# Patient Record
Sex: Female | Born: 1985 | Race: White | Hispanic: No | Marital: Single | Smoking: Never smoker
Health system: Southern US, Community
[De-identification: ages and names within clinical notes are randomized; demographics above are authoritative.]

## PROBLEM LIST (undated history)

## (undated) DIAGNOSIS — N946 Dysmenorrhea, unspecified: Secondary | ICD-10-CM

## (undated) DIAGNOSIS — E282 Polycystic ovarian syndrome: Secondary | ICD-10-CM

## (undated) DIAGNOSIS — K829 Disease of gallbladder, unspecified: Secondary | ICD-10-CM

## (undated) HISTORY — DX: Polycystic ovarian syndrome: E28.2

## (undated) HISTORY — PX: TRIGGER FINGER RELEASE: SHX641

---

## 2008-09-12 ENCOUNTER — Emergency Department (HOSPITAL_COMMUNITY): Admission: EM | Admit: 2008-09-12 | Discharge: 2008-09-12 | Payer: Self-pay | Admitting: Emergency Medicine

## 2012-01-01 ENCOUNTER — Emergency Department (HOSPITAL_COMMUNITY)
Admission: EM | Admit: 2012-01-01 | Discharge: 2012-01-01 | Disposition: A | Discharge status: Home / Self Care | Payer: BC Managed Care – PPO | Attending: Emergency Medicine | Admitting: Emergency Medicine

## 2012-01-01 ENCOUNTER — Emergency Department (HOSPITAL_COMMUNITY): Payer: BC Managed Care – PPO

## 2012-01-01 ENCOUNTER — Encounter (HOSPITAL_COMMUNITY): Payer: Self-pay | Admitting: Emergency Medicine

## 2012-01-01 DIAGNOSIS — K8050 Calculus of bile duct without cholangitis or cholecystitis without obstruction: Secondary | ICD-10-CM

## 2012-01-01 DIAGNOSIS — R0602 Shortness of breath: Secondary | ICD-10-CM | POA: Insufficient documentation

## 2012-01-01 DIAGNOSIS — Z79899 Other long term (current) drug therapy: Secondary | ICD-10-CM | POA: Insufficient documentation

## 2012-01-01 DIAGNOSIS — R1011 Right upper quadrant pain: Secondary | ICD-10-CM | POA: Insufficient documentation

## 2012-01-01 DIAGNOSIS — K8020 Calculus of gallbladder without cholecystitis without obstruction: Secondary | ICD-10-CM | POA: Insufficient documentation

## 2012-01-01 DIAGNOSIS — R079 Chest pain, unspecified: Secondary | ICD-10-CM | POA: Insufficient documentation

## 2012-01-01 DIAGNOSIS — N946 Dysmenorrhea, unspecified: Secondary | ICD-10-CM | POA: Insufficient documentation

## 2012-01-01 HISTORY — DX: Dysmenorrhea, unspecified: N94.6

## 2012-01-01 LAB — COMPREHENSIVE METABOLIC PANEL
Alkaline Phosphatase: 89 U/L (ref 39–117)
BUN: 8 mg/dL (ref 6–23)
CO2: 29 mEq/L (ref 19–32)
Chloride: 103 mEq/L (ref 96–112)
GFR calc Af Amer: >90 mL/min (ref >90)
Glucose, Bld: 100 mg/dL — ABNORMAL HIGH (ref 70–99)
Potassium: 3.6 mEq/L (ref 3.5–5.1)
Total Bilirubin: 0.8 mg/dL (ref 0.3–1.2)

## 2012-01-01 LAB — CBC WITH DIFFERENTIAL/PLATELET
Hemoglobin: 15.1 g/dL — ABNORMAL HIGH (ref 12.0–15.0)
Lymphs Abs: 2.6 10*3/uL (ref 0.7–4.0)
MCH: 30 pg (ref 26.0–34.0)
Monocytes Relative: 5 % (ref 3–12)
Neutro Abs: 7.8 10*3/uL — ABNORMAL HIGH (ref 1.7–7.7)
Neutrophils Relative %: 70 % (ref 43–77)
RBC: 5.03 MIL/uL (ref 3.87–5.11)

## 2012-01-01 LAB — D-DIMER, QUANTITATIVE: D-Dimer, Quant: 0.55 ug/mL-FEU — ABNORMAL HIGH (ref 0.00–0.48)

## 2012-01-01 MED ORDER — PANTOPRAZOLE SODIUM 40 MG IV SOLR
40.0000 mg | Freq: Once | INTRAVENOUS | Status: AC
  Administered 2012-01-01: 40 mg via INTRAVENOUS
  Filled 2012-01-01: qty 40

## 2012-01-01 MED ORDER — SODIUM CHLORIDE 0.9 % IV SOLN
Freq: Once | INTRAVENOUS | Status: AC
  Administered 2012-01-01: 04:00:00 via INTRAVENOUS

## 2012-01-01 MED ORDER — HYDROMORPHONE HCL PF 1 MG/ML IJ SOLN
1.0000 mg | Freq: Once | INTRAMUSCULAR | Status: DC
  Filled 2012-01-01: qty 1

## 2012-01-01 MED ORDER — ONDANSETRON HCL 4 MG/2ML IJ SOLN
4.0000 mg | Freq: Once | INTRAMUSCULAR | Status: AC
  Administered 2012-01-01: 4 mg via INTRAVENOUS
  Filled 2012-01-01: qty 2

## 2012-01-01 MED ORDER — HYDROCODONE-ACETAMINOPHEN 5-325 MG PO TABS
2.0000 | ORAL_TABLET | ORAL | Status: AC | PRN
Start: 2012-01-01 — End: 2012-01-11

## 2012-01-01 MED ORDER — ONDANSETRON HCL 4 MG PO TABS: 4.0000 mg | ORAL_TABLET | Freq: Four times a day (QID) | ORAL | Status: AC

## 2012-01-01 MED ORDER — HYDROMORPHONE HCL PF 1 MG/ML IJ SOLN
1.0000 mg | Freq: Once | INTRAMUSCULAR | Status: AC
  Administered 2012-01-01: 1 mg via INTRAVENOUS

## 2012-01-01 NOTE — ED Provider Notes (Addendum)
History     CSN: [id number]  Arrival date & time 01/01/12  0206   First MD Initiated Contact with Patient 01/01/12 [phone]      Chief Complaint  Patient presents with  . Chest Pain  . Shortness of Breath    (Consider location/radiation/quality/duration/timing/severity/associated sxs/prior treatment) The history is provided by the patient.    Past Medical History  Diagnosis Date  . Dysmenorrhea     No past surgical history on file.  No family history on file.  History  Substance Use Topics  . Smoking status: Never Smoker   . Smokeless tobacco: Not on file  . Alcohol Use: No    OB History    Grav Para Term Preterm Abortions TAB SAB Ect Mult Living                  Review of Systems  Constitutional: Negative for appetite change.  Respiratory: Positive for shortness of breath. Negative for cough.   Cardiovascular: Positive for chest pain.  Gastrointestinal: Positive for nausea. Negative for abdominal pain and diarrhea.  Genitourinary: Negative for dysuria.  Skin: Negative for rash and wound.    Allergies  Review of patient's allergies indicates no known allergies.  Home Medications   Current Outpatient Rx  Name Route Sig Dispense Refill  . CALCIUM CARBONATE ANTACID 750 MG PO CHEW Oral Chew 2 tablets by mouth daily.    Marland Kitchen HYDROCODONE-ACETAMINOPHEN 5-325 MG PO TABS Oral Take 2 tablets by mouth every 4 (four) hours as needed for pain. 6 tablet 0  . ONDANSETRON HCL 4 MG PO TABS Oral Take 1 tablet (4 mg total) by mouth every 6 (six) hours. 12 tablet 0    BP 92/61  Pulse 60  Temp 98.4 F (36.9 C) (Oral)  Resp 10  SpO2 92%  LMP 12/18/2011  Physical Exam  Constitutional: She appears well-developed and well-nourished.  HENT:  Head: Normocephalic.  Eyes: Pupils are equal, round, and reactive to light.  Neck: Normal range of motion.  Cardiovascular: Normal rate.   Pulmonary/Chest: Effort normal.  Abdominal: Soft. Bowel sounds are normal. She exhibits no  distension. There is tenderness in the right upper quadrant.  Musculoskeletal: Normal range of motion.  Skin: Skin is warm.    ED Course  Procedures (including critical care time)  Labs Reviewed  CBC WITH DIFFERENTIAL - Abnormal; Notable for the following:    WBC 11.1 (*)     Hemoglobin 15.1 (*)     Neutro Abs 7.8 (*)     All other components within normal limits  COMPREHENSIVE METABOLIC PANEL - Abnormal; Notable for the following:    Glucose, Bld 100 (*)     Calcium 10.6 (*)     Total Protein 8.5 (*)     All other components within normal limits  D-DIMER, QUANTITATIVE - Abnormal; Notable for the following:    D-Dimer, Quant 0.55 (*)     All other components within normal limits  POCT I-STAT TROPONIN I  POCT PREGNANCY, URINE   Dg Chest 2 View  01/01/2012  *RADIOLOGY REPORT*  Clinical Data: Mid chest pain  CHEST - 2 VIEW  Comparison: None.  Findings: Lungs are clear. No pleural effusion or pneumothorax. The cardiomediastinal contours are within normal limits. The visualized bones and soft tissues are without significant appreciable abnormality.  IMPRESSION: No radiographic evidence of acute cardiopulmonary process.   Original Report Authenticated By: Waneta Martins, M.D.    US Abdomen Complete  01/01/2012  *RADIOLOGY REPORT*  Clinical Data:  Right upper quadrant abdominal pain.  COMPLETE ABDOMINAL ULTRASOUND  Comparison:  None.  Findings:  Gallbladder:  No shadowing gallstones. Adherent echogenic foci that measure up to 6 mm may correspond to to effective sludge or polyps. No gallbladder wall thickening.  Negative sonographic Murphy's sign.  Common bile duct:  Measures up to 6 mm proximally.  The distal duct is obscured by overlying bowel gas artifact.  Liver:  No focal lesion identified.  Within normal limits in parenchymal echogenicity.  IVC:  Appears normal.  Pancreas:  Inadequately visualized due to bowel gas artifact.  Spleen:  Measures 8.5 cm the.  Normal echogenicity.  Right  Kidney:  Measures 9.4 cm.  Echogenicity appears increased. No hydronephrosis.  Left Kidney:  Measures 9.8 cm.  Increased echogenicity.  No hydronephrosis.  Abdominal aorta:  No aneurysmal dilatation identified.  IMPRESSION: Echogenic foci adherent to the gallbladder wall are nonspecific, favored to correspond to tumefactive sludge or small polyps.  No sonographic evidence for cholecystitis.  The CBD measures up to 6 mm proximally.  The distal duct is obscured as is the pancreas by overlying bowel gas artifact.  Increased renal parenchymal echogenicity can be seen with medical renal disease.  Correlate with creatinine.   Original Report Authenticated By: Waneta Martins, M.D.    ED ECG REPORT   Date: 01/29/2012  EKG Time: 8:00 PM  Rate: 86  Rhythm: normal sinus rhythm,   Axis: right  Intervals:none  ST&T Change: normal   Narrative Interpretation: normal            1. Biliary colic       MDM   Ultrasound reviewed no stones but sludge-- most likely patient with Biliary Colic         Arman Filter, NP 01/01/12 0606  Arman Filter, NP 01/01/12 0606  Arman Filter, NP 01/29/12 2001

## 2012-01-01 NOTE — ED Notes (Signed)
Pt awake lying in bed and complaining of chest pain.

## 2012-01-01 NOTE — ED Provider Notes (Signed)
Medical screening examination/treatment/procedure(s) were performed by non-physician practitioner and as supervising physician I was immediately available for consultation/collaboration.   Hanley Seamen, MD 01/01/12 [phone]

## 2012-01-01 NOTE — ED Notes (Signed)
Patient complaining of constant "sharp" mid-sternal chest pain that began this evening; patient reports shortness of breath and nausea; denies vomiting, diarrhea, and abdominal pain.  Patient rates pain 10/10 on the numerical pain scale; seems anxious during assessment.  Patient smokes marijuana; denies smoking prior to chest pain starting.

## 2012-01-01 NOTE — ED Notes (Signed)
Pt for discharge.Vital signs stable and GCS 15 

## 2012-09-03 ENCOUNTER — Encounter (INDEPENDENT_AMBULATORY_CARE_PROVIDER_SITE_OTHER): Payer: Self-pay | Admitting: General Surgery

## 2012-09-03 ENCOUNTER — Other Ambulatory Visit (INDEPENDENT_AMBULATORY_CARE_PROVIDER_SITE_OTHER): Payer: Self-pay

## 2012-09-03 ENCOUNTER — Ambulatory Visit (INDEPENDENT_AMBULATORY_CARE_PROVIDER_SITE_OTHER): Payer: BC Managed Care – PPO | Admitting: General Surgery

## 2012-09-03 VITALS — BP 92/60 | HR 80 | Temp 97.3°F | Resp 16 | Ht 60.75 in | Wt 139.4 lb

## 2012-09-03 DIAGNOSIS — R109 Unspecified abdominal pain: Secondary | ICD-10-CM

## 2012-09-03 DIAGNOSIS — K828 Other specified diseases of gallbladder: Secondary | ICD-10-CM

## 2012-09-03 NOTE — Patient Instructions (Signed)
It is not clear to me what is causing your abdominal pain and nausea.  You had intermittent constipation and that could be contributing to this.  You also have sludge in your gallbladder so we cannot prove that the gallbladder is not causing some of the problems.  You will be scheduled for blood test and a nuclear medicine hepatobiliary scan  You are advised to take 6 Metamucil capsules in the morning and 6 Metamucil capsules the evening and drink 16 ounces of water or juice with it each time. This will correct the constipation.  You had been given a prescription for Protonix. Please take that for the next 2 months.  Return to see Dr. Derrell Lolling in approximately 3 weeks after the tests are done and you have had a three-week trial of the Protonix medication.

## 2012-09-03 NOTE — Progress Notes (Signed)
Patient ID: Priscilla Rodriguez, female   DOB: 13-Mar-1986, 27 y.o.   MRN: [id number]  Chief Complaint  Patient presents with  . New Evaluation    eval anbd pain w/ Hx of GB sludge vs. polyp    HPI Priscilla Rodriguez is a 27 y.o. female.  She is referred by Dr. Sigmund Hazel at [place] at [facility] for evaluation of abdominal pain, nausea, constipation, and gallbladder sludge.  The patient gives a one-year history of vague abdominal pains. Sometimes upper, sometimes lower, sometimes right, sometimes left, sometimes diffuse. She gets some bloating and nausea but does not vomit. Sometimes this is triggered by drinking sweet tea. She worries about fatty foods but cannot really say that foods triggered this. She went to the emergency room and on 01/01/2012 an ultrasound which shows echogenic foci in the gallbladder, sludge versus polyps. She was sent home.  She has problems with constipation intermittently. She denies weight loss. She is anxious because she has not been given a diagnosis .  Marland Kitchen She saw Dr. Dulce Sellar a few months ago and was told that the constipation was the main problem. She did not have an endoscopy.  She's been treated constipation with reasonable success but still has the frequent abdominal pain and nausea. The only medicine she takes is TUMS and multivitamins.  She does not appear to be any physical distress today but does seem anxious. HPI  Past Medical History  Diagnosis Date  . Dysmenorrhea     Past Surgical History  Procedure Laterality Date  . Trigger finger release      Family History  Problem Relation Age of Onset  . Hyperlipidemia Father   . Cancer Paternal Grandmother     breast    Social History History  Substance Use Topics  . Smoking status: Never Smoker   . Smokeless tobacco: Never Used  . Alcohol Use: No    No Known Allergies  Current Outpatient Prescriptions  Medication Sig Dispense Refill  . calcium carbonate (TUMS EX) 750 MG chewable tablet Chew 2  tablets by mouth daily.      . Mefenamic Acid (PONSTEL PO) Take by mouth.      . Multiple Vitamin (MULTIVITAMIN) tablet Take 1 tablet by mouth daily.       No current facility-administered medications for this visit.    Review of Systems Review of Systems  Constitutional: Negative for fever, chills and unexpected weight change.  HENT: Negative for hearing loss, congestion, sore throat, trouble swallowing and voice change.   Eyes: Negative for visual disturbance.  Respiratory: Negative for cough and wheezing.   Cardiovascular: Negative for chest pain, palpitations and leg swelling.  Gastrointestinal: Positive for nausea, abdominal pain and abdominal distention. Negative for vomiting, diarrhea, constipation, blood in stool and anal bleeding.  Genitourinary: Negative for hematuria, vaginal bleeding and difficulty urinating.  Musculoskeletal: Negative for arthralgias.  Skin: Negative for rash and wound.  Neurological: Negative for seizures, syncope and headaches.  Hematological: Negative for adenopathy. Does not bruise/bleed easily.  Psychiatric/Behavioral: Negative for confusion.    Blood pressure 92/60, pulse 80, temperature 97.3 F (36.3 C), temperature source Temporal, resp. rate 16, height 5' 0.75" (1.543 m), weight 139 lb 6.4 oz (63.231 kg).  Physical Exam Physical Exam  Constitutional: She is oriented to person, place, and time. She appears well-developed and well-nourished. No distress.  HENT:  Head: Normocephalic and atraumatic.  Nose: Nose normal.  Mouth/Throat: No oropharyngeal exudate.  Eyes: Conjunctivae and EOM are normal. Pupils are equal, round,  and reactive to light. Left eye exhibits no discharge. No scleral icterus.  Neck: Neck supple. No JVD present. No tracheal deviation present. No thyromegaly present.  Cardiovascular: Normal rate, regular rhythm, normal heart sounds and intact distal pulses.   No murmur heard. Pulmonary/Chest: Effort normal and breath sounds  normal. No respiratory distress. She has no wheezes. She has no rales. She exhibits no tenderness.  Abdominal: Soft. Bowel sounds are normal. She exhibits no distension and no mass. There is no tenderness. There is no rebound and no guarding.  Musculoskeletal: She exhibits no edema and no tenderness.  Lymphadenopathy:    She has no cervical adenopathy.  Neurological: She is alert and oriented to person, place, and time. She exhibits normal muscle tone. Coordination normal.  Skin: Skin is warm. No rash noted. She is not diaphoretic. No erythema. No pallor.  Psychiatric: She has a normal mood and affect. Her behavior is normal. Judgment and thought content normal.    Data Reviewed Gallbladder ultrasound from August, 2013. Dr. Rondel Baton office notes.  Assessment    Vague abdominal pain, nausea, no bladder slowed sludge on ultrasound. This might be due to constipation. It seems atypical for gallbladder pain. Because it has persisted I think that we should pursue the diagnosis a little further   There is no immediate indication for surgical intervention.    Plan    We'll draw CBC, complete metabolic panel, and lipase again.  We'll get CCK stimulated hepatobiliary scan  Obtained Dr. Hulen Shouts GI consultation  Trial of Protonix 40 mg daily, 30 day supply with one refill good given  Metamucil capsules 6 in the morning and 6 in the evening before titration  She was also advised to see her gynecologist because of the lower abdominal pain component.  Return to see me in 3 weeks        Lyndal Alamillo M. Derrell Lolling, M.D., [facility] General and Minimally invasive Surgery Breast and Colorectal Surgery Office:   [phone] Pager:   [phone]  09/03/2012, 3:43 PM

## 2012-09-12 ENCOUNTER — Ambulatory Visit (HOSPITAL_COMMUNITY)
Admission: RE | Admit: 2012-09-12 | Discharge: 2012-09-12 | Disposition: A | Discharge status: Home / Self Care | Payer: BC Managed Care – PPO | Source: Ambulatory Visit | Attending: General Surgery | Admitting: General Surgery

## 2012-09-12 DIAGNOSIS — R109 Unspecified abdominal pain: Secondary | ICD-10-CM

## 2012-09-12 DIAGNOSIS — K828 Other specified diseases of gallbladder: Secondary | ICD-10-CM

## 2012-09-12 DIAGNOSIS — R1084 Generalized abdominal pain: Secondary | ICD-10-CM | POA: Insufficient documentation

## 2012-09-12 DIAGNOSIS — R110 Nausea: Secondary | ICD-10-CM | POA: Insufficient documentation

## 2012-09-12 MED ORDER — SINCALIDE 5 MCG IJ SOLR
INTRAMUSCULAR | Status: AC
  Filled 2012-09-12: qty 5

## 2012-09-12 MED ORDER — SINCALIDE 5 MCG IJ SOLR: 0.0200 ug/kg | Freq: Once | INTRAMUSCULAR | Status: DC

## 2012-09-12 MED ORDER — TECHNETIUM TC 99M MEBROFENIN IV KIT
5.0000 | PACK | Freq: Once | INTRAVENOUS | Status: AC | PRN
  Administered 2012-09-12: 5 via INTRAVENOUS

## 2012-09-24 ENCOUNTER — Encounter (INDEPENDENT_AMBULATORY_CARE_PROVIDER_SITE_OTHER): Payer: Self-pay | Admitting: General Surgery

## 2012-09-24 ENCOUNTER — Telehealth (INDEPENDENT_AMBULATORY_CARE_PROVIDER_SITE_OTHER): Payer: Self-pay | Admitting: General Surgery

## 2012-09-24 ENCOUNTER — Ambulatory Visit (INDEPENDENT_AMBULATORY_CARE_PROVIDER_SITE_OTHER): Payer: BC Managed Care – PPO | Admitting: General Surgery

## 2012-09-24 VITALS — BP 100/60 | HR 94 | Temp 98.3°F | Resp 18 | Ht 60.75 in | Wt 137.8 lb

## 2012-09-24 DIAGNOSIS — K828 Other specified diseases of gallbladder: Secondary | ICD-10-CM

## 2012-09-24 NOTE — Progress Notes (Signed)
Patient ID: Priscilla Rodriguez, female   DOB: 1985-12-29, 27 y.o.   MRN: [id number] History: This patient returns for further evaluation and management of her abdominal pains, bloating, diarrhea. I initially evaluated her on 09/03/2012 after an ultrasound showed sludge versus polyps, nonspecific finding. She was placed on proton pump inhibitors which have neither helped nor made her worse. She continues to complain of intermittent abdominal pain sometimes upper sometimes lower, not related to food. She gets a lot of diarrhea when she eats fatty foods or pizza or cheese. Rare nausea. No vomiting. She is anxious about this but does not want to have surgery unless it is indicated.Hepatobiliary scan is completely normal. Normal uptake and excretion, normal ejection fraction, pain was not reproduced by CCK infusion.  ROS: Bloating. Nausea. Diarrhea. Nonlocalizing abdominal pain. Anxiety. Otherwise 10 system review of systems is negative except as described above  Exam: Patient is pleasant and cooperative. She is clearly anxious but in no physical distress. Neck no adenopathy or mass Lungs clear to auscultation bilaterally Heart regular rate and rhythm. No murmur. No tachycardia. No ectopy Abdomen soft. Nondistended. Objectively nontender. No localizing findings. No mass. No hernia  Assessment: Abdominal pain, bloating, diarrhea of uncertain etiology. Given the atypical history, a nonspecific ultrasound, and a normal CCK stimulated HIDA scan, I do not think that surgical intervention is warranted at this time. I told her that there was less than 50% chance that it would resolve her symptoms.  Plan: Continue proton pump inhibitors Low-fat, lactose free diet Refer back to Dr. Dulce Sellar for further GI evaluation.   Angelia Mould. Derrell Lolling, M.D., [facility] General and Minimally invasive Surgery Breast and Colorectal Surgery Office:   [phone] Pager:   [phone]

## 2012-09-24 NOTE — Patient Instructions (Signed)
Your hepatobiliary scan is completely normal. This strongly suggests that the gallbladder is not the cause of your problems. There is no indication for surgery at this time.  I recommend a diet free of milk and milk products and cheese.  You will be referred back to Dr. Dulce Sellar for further GI evaluation.

## 2012-09-24 NOTE — Telephone Encounter (Signed)
Spoke with patient she is aware of appt with Dr Dulce Sellar on 10/15/12 at 330pm

## 2013-07-14 ENCOUNTER — Other Ambulatory Visit (HOSPITAL_COMMUNITY): Payer: Self-pay | Admitting: Obstetrics & Gynecology

## 2013-07-14 DIAGNOSIS — Q5128 Other doubling of uterus, other specified: Secondary | ICD-10-CM

## 2013-07-14 DIAGNOSIS — Q512 Other doubling of uterus, unspecified: Principal | ICD-10-CM

## 2013-07-18 ENCOUNTER — Ambulatory Visit (HOSPITAL_COMMUNITY)
Admission: RE | Admit: 2013-07-18 | Discharge: 2013-07-18 | Disposition: A | Discharge status: Home / Self Care | Payer: BC Managed Care – PPO | Source: Ambulatory Visit | Attending: Obstetrics & Gynecology | Admitting: Obstetrics & Gynecology

## 2013-07-18 DIAGNOSIS — Q512 Other doubling of uterus, unspecified: Principal | ICD-10-CM

## 2013-07-18 DIAGNOSIS — Q5128 Other doubling of uterus, other specified: Secondary | ICD-10-CM

## 2013-08-22 ENCOUNTER — Encounter: Payer: BC Managed Care – PPO | Attending: Obstetrics & Gynecology | Admitting: *Deleted

## 2013-08-22 ENCOUNTER — Encounter: Payer: Self-pay | Admitting: *Deleted

## 2013-08-22 VITALS — Ht 61.0 in | Wt 152.0 lb

## 2013-08-22 DIAGNOSIS — E282 Polycystic ovarian syndrome: Secondary | ICD-10-CM | POA: Insufficient documentation

## 2013-08-22 DIAGNOSIS — Z713 Dietary counseling and surveillance: Secondary | ICD-10-CM | POA: Insufficient documentation

## 2013-08-22 NOTE — Progress Notes (Signed)
  Medical Nutrition Therapy:  Appt start time: 1030 end time:  1130.   Assessment:  Primary concerns today: Priscilla Rodriguez is here for nutrition counseling pertaining to recent diagnosis of PCOS.  She is interested in knowing what to eat.  Priscilla Rodriguez lives with fiance and they share grocery shopping responsibilities.  He does the cooking.  He grills occasionally, fries seldom and usually he bakes.  Sometimes they get fast food.  She works [employer] and her schedule is varied.  She skips breakfast regularly.  She eats in the living room, but doesn't watch tv.  She thinks she is a slow eater.  Mom inundated her with anti-fat messages.  Mom binged on sweets.  (most likely had disordered eating)  Preferred Learning Style:   No preference indicated   Learning Readiness:   Ready   MEDICATIONS: see list   DIETARY INTAKE:  Usual eating pattern includes 2 meals and 0-2 snacks per day.  Everyday foods include refined carbohydrates, fatty meat.  Avoided foods include none.    24-hr recall: grazes sometimes.  And makes poor dietary choices B ( AM): skips most of the time  Snk ( AM): might get something from vending machine or eat a granola bar  L ( PM): mcDondals Snk ( PM): maybe granola bar or sting cheese D ( PM): cookout or wendy's salad Snk ( PM): not usually Beverages: water.  Occasionally sweet tea or soda.  No milk.  Drinks almond or soy milk with cereal  Usual physical activity: not really.  Walks around neighborhood sometimes  Estimated energy needs: 1600 calories 180 g carbohydrates 120 g protein 44 g fat  Progress Towards Goal(s):  In progress.   Nutritional Diagnosis:  [place]-2.1 Inpaired nutrition utilization As related to carbohydrates.  As evidenced by PCOS diagnosis.    Intervention:  Nutrition counseling provided.  Discussed physiology of carbohydrate metabolism and how it is affected by PCOS.  Discussed hormonal imbalances associated with PCOS and how those imbalances present  themselves with hirsutism, body acne, menstrual irregularity, obesity, and poor glycemic control.  Dicussed possible increased risk for CVD and the importance of nutrition management for overall health.  Discussed medication management for PCOS and role of metformin  Recommended the Mediterranean style eating plan: MUFAs, whole grains, fruits, vegetables, legumes, lean proteins, and low-fat dairy.  Recommended limiting refined carbohydrates and concentrated sweets.  Suggested regularly scheduled meals and snacks and to avoid meal skipping.  Recommended fiber and lean protein with all meals and to include non-starchy vegetables with most meals.    Recommended regular physical activity of 150 minutes/week.  Discussed reading food labels: focusing on fiber and limited sugars and saturated, trans fats.   Teaching Method Utilized:  Visual Auditory   Handouts given during visit include:  Mediterranean lifestyle  Reading food labels  Barriers to learning/adherence to lifestyle change: schedule and fiance's eating habits  Demonstrated degree of understanding via:  Teach Back   Monitoring/Evaluation:  Dietary intake, exercise,  and body weight in 3 month(s).

## 2013-10-22 ENCOUNTER — Emergency Department (HOSPITAL_COMMUNITY): Payer: BC Managed Care – PPO

## 2013-10-22 ENCOUNTER — Emergency Department (HOSPITAL_COMMUNITY)
Admission: EM | Admit: 2013-10-22 | Discharge: 2013-10-22 | Disposition: A | Discharge status: Home / Self Care | Payer: BC Managed Care – PPO | Attending: Emergency Medicine | Admitting: Emergency Medicine

## 2013-10-22 ENCOUNTER — Encounter (HOSPITAL_COMMUNITY): Payer: Self-pay | Admitting: Emergency Medicine

## 2013-10-22 DIAGNOSIS — Z79899 Other long term (current) drug therapy: Secondary | ICD-10-CM | POA: Insufficient documentation

## 2013-10-22 DIAGNOSIS — Z862 Personal history of diseases of the blood and blood-forming organs and certain disorders involving the immune mechanism: Secondary | ICD-10-CM | POA: Insufficient documentation

## 2013-10-22 DIAGNOSIS — K859 Acute pancreatitis without necrosis or infection, unspecified: Secondary | ICD-10-CM

## 2013-10-22 DIAGNOSIS — Z8742 Personal history of other diseases of the female genital tract: Secondary | ICD-10-CM | POA: Insufficient documentation

## 2013-10-22 DIAGNOSIS — Z3202 Encounter for pregnancy test, result negative: Secondary | ICD-10-CM | POA: Insufficient documentation

## 2013-10-22 DIAGNOSIS — Z8639 Personal history of other endocrine, nutritional and metabolic disease: Secondary | ICD-10-CM | POA: Insufficient documentation

## 2013-10-22 HISTORY — DX: Disease of gallbladder, unspecified: K82.9

## 2013-10-22 LAB — URINALYSIS, ROUTINE W REFLEX MICROSCOPIC
Bilirubin Urine: NEGATIVE
Glucose, UA: NEGATIVE mg/dL
Hgb urine dipstick: NEGATIVE
Ketones, ur: NEGATIVE mg/dL
LEUKOCYTES UA: NEGATIVE
NITRITE: NEGATIVE
PROTEIN: NEGATIVE mg/dL
SPECIFIC GRAVITY, URINE: 1.015 (ref 1.005–1.030)
Urobilinogen, UA: 0.2 mg/dL (ref 0.0–1.0)
pH: 6.5 (ref 5.0–8.0)

## 2013-10-22 LAB — BASIC METABOLIC PANEL
BUN: 9 mg/dL (ref 6–23)
CALCIUM: 9.1 mg/dL (ref 8.4–10.5)
CO2: 22 mEq/L (ref 19–32)
Chloride: 104 mEq/L (ref 96–112)
Creatinine, Ser: 0.83 mg/dL (ref 0.50–1.10)
GLUCOSE: 99 mg/dL (ref 70–99)
Potassium: 3.7 mEq/L (ref 3.7–5.3)
Sodium: 139 mEq/L (ref 137–147)

## 2013-10-22 LAB — CBC WITH DIFFERENTIAL/PLATELET
BASOS ABS: 0 10*3/uL (ref 0.0–0.1)
Basophils Relative: 0 % (ref 0–1)
EOS PCT: 1 % (ref 0–5)
Eosinophils Absolute: 0.1 10*3/uL (ref 0.0–0.7)
HCT: 38.3 % (ref 36.0–46.0)
Hemoglobin: 12.6 g/dL (ref 12.0–15.0)
LYMPHS ABS: 3 10*3/uL (ref 0.7–4.0)
LYMPHS PCT: 26 % (ref 12–46)
MCH: 29.8 pg (ref 26.0–34.0)
MCHC: 32.9 g/dL (ref 30.0–36.0)
MCV: 90.5 fL (ref 78.0–100.0)
Monocytes Absolute: 0.7 10*3/uL (ref 0.1–1.0)
Monocytes Relative: 6 % (ref 3–12)
NEUTROS ABS: 7.9 10*3/uL — AB (ref 1.7–7.7)
Neutrophils Relative %: 67 % (ref 43–77)
PLATELETS: 290 10*3/uL (ref 150–400)
RBC: 4.23 MIL/uL (ref 3.87–5.11)
RDW: 12.8 % (ref 11.5–15.5)
WBC: 11.8 10*3/uL — AB (ref 4.0–10.5)

## 2013-10-22 LAB — HEPATIC FUNCTION PANEL
ALBUMIN: 3.5 g/dL (ref 3.5–5.2)
ALK PHOS: 64 U/L (ref 39–117)
ALT: 14 U/L (ref 0–35)
AST: 21 U/L (ref 0–37)
Bilirubin, Direct: <0.2 mg/dL (ref 0.0–0.3)
TOTAL PROTEIN: 7 g/dL (ref 6.0–8.3)
Total Bilirubin: 0.3 mg/dL (ref 0.3–1.2)

## 2013-10-22 LAB — LIPASE, BLOOD: LIPASE: 185 U/L — AB (ref 11–59)

## 2013-10-22 LAB — POC URINE PREG, ED: PREG TEST UR: NEGATIVE

## 2013-10-22 MED ORDER — SODIUM CHLORIDE 0.9 % IV BOLUS (SEPSIS)
1000.0000 mL | Freq: Once | INTRAVENOUS | Status: AC
  Administered 2013-10-22: 1000 mL via INTRAVENOUS

## 2013-10-22 MED ORDER — OXYCODONE-ACETAMINOPHEN 5-325 MG PO TABS: 2.0000 | ORAL_TABLET | ORAL | Status: DC | PRN

## 2013-10-22 MED ORDER — ONDANSETRON 4 MG PO TBDP: ORAL_TABLET | ORAL | Status: DC

## 2013-10-22 MED ORDER — MORPHINE SULFATE 4 MG/ML IJ SOLN
4.0000 mg | Freq: Once | INTRAMUSCULAR | Status: AC
  Administered 2013-10-22: 4 mg via INTRAVENOUS
  Filled 2013-10-22: qty 1

## 2013-10-22 MED ORDER — ONDANSETRON HCL 4 MG/2ML IJ SOLN
4.0000 mg | Freq: Once | INTRAMUSCULAR | Status: AC
  Administered 2013-10-22: 4 mg via INTRAVENOUS
  Filled 2013-10-22: qty 2

## 2013-10-22 NOTE — ED Provider Notes (Signed)
CSN: [id number]     Arrival date & time 10/22/13  0341 History   First MD Initiated Contact with Patient 10/22/13 0431     Chief Complaint  Patient presents with  . Abdominal Pain     (Consider location/radiation/quality/duration/timing/severity/associated sxs/prior Treatment) HPI Patient presents with right upper quadrant and epigastric pain starting roughly one hour after eating at 2330. She states the pain has been constant since starting. She's had no nausea or vomiting. She denies any fevers or chills. She took a Vicodin at home with little relief. She has a history of gallbladder sludge. This pain is similar to previous "attacks" in the past though this is more severe and more persistent. She denies any chest pain. She's had no urinary symptoms including dysuria, frequency, hematuria. She denies any vaginal bleeding or discharge. Past Medical History  Diagnosis Date  . Dysmenorrhea   . PCOS (polycystic ovarian syndrome)   . Gallbladder attack    Past Surgical History  Procedure Laterality Date  . Trigger finger release     Family History  Problem Relation Age of Onset  . Hyperlipidemia Father   . Cancer Paternal Grandmother     breast   History  Substance Use Topics  . Smoking status: Never Smoker   . Smokeless tobacco: Never Used  . Alcohol Use: No     Comment: 3-5 times/year.   OB History   Grav Para Term Preterm Abortions TAB SAB Ect Mult Living                 Review of Systems  Constitutional: Negative for fever and chills.  Respiratory: Negative for cough and shortness of breath.   Cardiovascular: Negative for chest pain.  Gastrointestinal: Positive for abdominal pain. Negative for nausea, vomiting, diarrhea and constipation.  Genitourinary: Negative for dysuria, frequency, hematuria and flank pain.  Musculoskeletal: Negative for back pain, myalgias, neck pain and neck stiffness.  Skin: Negative for rash and wound.  Neurological: Negative for dizziness,  weakness, numbness and headaches.  All other systems reviewed and are negative.     Allergies  Review of patient's allergies indicates no known allergies.  Home Medications   Prior to Admission medications   Medication Sig Start Date End Date Taking? Authorizing Provider  Mefenamic Acid (PONSTEL PO) Take 1 capsule by mouth as needed (for pain.).    Yes Historical Provider, MD  metFORMIN (GLUCOPHAGE) 500 MG tablet Take 500 mg by mouth 2 (two) times daily with a meal.   Yes Historical Provider, MD  Multiple Vitamin (MULTIVITAMIN) tablet Take 1 tablet by mouth daily.   Yes Historical Provider, MD  norgestimate-ethinyl estradiol (ORTHO-CYCLEN,SPRINTEC,PREVIFEM) 0.25-35 MG-MCG tablet Take 1 tablet by mouth daily.   Yes Historical Provider, MD   BP 130/81  Pulse 112  Temp(Src) 98.3 F (36.8 C) (Oral)  Resp 22  Ht 5' (1.524 m)  Wt 147 lb 0.8 oz (66.701 kg)  BMI 28.72 kg/m2  SpO2 100%  LMP 10/09/2013 Physical Exam  Nursing note and vitals reviewed. Constitutional: She is oriented to person, place, and time. She appears well-developed and well-nourished. No distress.  HENT:  Head: Normocephalic and atraumatic.  Mouth/Throat: Oropharynx is clear and moist.  Eyes: EOM are normal. Pupils are equal, round, and reactive to light.  Neck: Normal range of motion. Neck supple.  Cardiovascular: Normal rate and regular rhythm.   Pulmonary/Chest: Effort normal and breath sounds normal. No respiratory distress. She has no wheezes. She has no rales.  Abdominal: Soft. Bowel sounds are  normal. She exhibits no distension and no mass. There is tenderness (patient with right upper quadrant tenderness to palpation. No rebound or guarding.). There is no rebound and no guarding.  Musculoskeletal: Normal range of motion. She exhibits no edema and no tenderness.  No CVA tenderness bilaterally. No calf swelling or tenderness.  Neurological: She is alert and oriented to person, place, and time.  Skin: Skin is  warm and dry. No rash noted. No erythema.  Psychiatric: She has a normal mood and affect. Her behavior is normal.    ED Course  Procedures (including critical care time) Labs Review Labs Reviewed  CBC WITH DIFFERENTIAL - Abnormal; Notable for the following:    WBC 11.8 (*)    Neutro Abs 7.9 (*)    All other components within normal limits  URINALYSIS, ROUTINE W REFLEX MICROSCOPIC  BASIC METABOLIC PANEL  LIPASE, BLOOD  HEPATIC FUNCTION PANEL  POC URINE PREG, ED    Imaging Review No results found.   EKG Interpretation None      MDM   Final diagnoses:  None    Patient's symptoms have improved. She is resting comfortably. Her abdomen is soft and benign. She is informed of the results of her labs and imaging. She is advised to followup with her gastroenterologist. Return precautions have been given.    Loren Raceravid Mikelle Myrick, MD 10/22/13 [phone]

## 2013-10-22 NOTE — ED Notes (Signed)
Patient c/o sudden onset upper mid abd pain after dinner last night. Patient states pain radiates into her back.

## 2013-10-22 NOTE — ED Notes (Signed)
US at bedside

## 2013-10-22 NOTE — ED Notes (Signed)
Pt c/o upper abd pain radiating to her back onset last evening after eating dinner (fried chicken and chocolate cake) pt states she has had this pain previously and was found to have "sludge" in her gallbladder.

## 2013-10-22 NOTE — Discharge Instructions (Signed)

## 2013-11-06 ENCOUNTER — Other Ambulatory Visit: Payer: Self-pay | Admitting: Family Medicine

## 2013-11-06 DIAGNOSIS — R109 Unspecified abdominal pain: Secondary | ICD-10-CM

## 2013-11-17 ENCOUNTER — Other Ambulatory Visit: Payer: BC Managed Care – PPO

## 2013-11-24 ENCOUNTER — Encounter: Payer: BC Managed Care – PPO | Attending: Obstetrics & Gynecology | Admitting: *Deleted

## 2013-11-24 DIAGNOSIS — Z713 Dietary counseling and surveillance: Secondary | ICD-10-CM | POA: Diagnosis not present

## 2013-11-24 DIAGNOSIS — E282 Polycystic ovarian syndrome: Secondary | ICD-10-CM | POA: Insufficient documentation

## 2013-11-24 NOTE — Patient Instructions (Addendum)
[email or web address]  Consider protein shakes  Goals:  Eat 3 meals/day, Avoid meal skipping   Increase protein rich foods  Limit carbohydrate1-2 servings/meal   Choose more whole grains, lean protein, low-fat dairy, and fruits/non-starchy vegetables.   Aim for >30 min of physical activity daily  Limit sugar-sweetened beverages and concentrated sweets  Take metformin with food twice a day
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
## 2014-02-18 ENCOUNTER — Telehealth: Payer: Self-pay | Admitting: Internal Medicine

## 2014-02-18 NOTE — Telephone Encounter (Signed)
Rec'd from [facility] @ [place] forward 20 pages to Dr.Gherghe

## 2014-02-24 ENCOUNTER — Ambulatory Visit: Payer: BC Managed Care – PPO | Admitting: *Deleted

## 2014-02-24 ENCOUNTER — Encounter: Payer: BC Managed Care – PPO | Attending: Family Medicine | Admitting: *Deleted

## 2014-02-24 DIAGNOSIS — E282 Polycystic ovarian syndrome: Secondary | ICD-10-CM

## 2014-02-24 DIAGNOSIS — N946 Dysmenorrhea, unspecified: Secondary | ICD-10-CM | POA: Diagnosis not present

## 2014-02-24 DIAGNOSIS — Z713 Dietary counseling and surveillance: Secondary | ICD-10-CM | POA: Diagnosis not present

## 2014-02-24 NOTE — Progress Notes (Signed)
Appointment start time: 1215  Appointment end time: 1230  Assessment:  Priscilla Rodriguez is here for follow up nutrition counseling pertaining to PCOS.  She stopped taking her metformin because she is not doing well emotionally.  She is also not eating well and has not been able to exercise.  Things are not good financially and things are not going well with her live-in boyfriend.  She is very upset and not able to practice good self-care.  She has attempted to contact her psychiatrist, but they don't have availability until thanksgiving.  She has also tried to schedule an appointment with endocrinology, but has been unsuccessful  Intervention:  Recommended therapy.  Patient was very receptive to list of therapy services available in the area.  Also gave contact information for [facility] mobile crisis line.  Monitoring: prn

## 2014-03-12 ENCOUNTER — Emergency Department (HOSPITAL_COMMUNITY)
Admission: EM | Admit: 2014-03-12 | Discharge: 2014-03-13 | Disposition: A | Discharge status: Home / Self Care | Payer: BC Managed Care – PPO | Attending: Emergency Medicine | Admitting: Emergency Medicine

## 2014-03-12 ENCOUNTER — Encounter (HOSPITAL_COMMUNITY): Payer: Self-pay

## 2014-03-12 DIAGNOSIS — K828 Other specified diseases of gallbladder: Secondary | ICD-10-CM

## 2014-03-12 DIAGNOSIS — Z8742 Personal history of other diseases of the female genital tract: Secondary | ICD-10-CM | POA: Insufficient documentation

## 2014-03-12 DIAGNOSIS — R1013 Epigastric pain: Secondary | ICD-10-CM | POA: Insufficient documentation

## 2014-03-12 DIAGNOSIS — Z79899 Other long term (current) drug therapy: Secondary | ICD-10-CM | POA: Insufficient documentation

## 2014-03-12 DIAGNOSIS — R1011 Right upper quadrant pain: Secondary | ICD-10-CM | POA: Insufficient documentation

## 2014-03-12 DIAGNOSIS — K829 Disease of gallbladder, unspecified: Secondary | ICD-10-CM | POA: Insufficient documentation

## 2014-03-12 NOTE — ED Notes (Signed)
Pt presents with c/o abdominal pain that radiates around to both flank areas. Pt reports that the pain started earlier this evening. Pt denies any N/V/D or hx of kidney stones. Pt denies any injury to her back. Pt is very anxious and tearful in triage.

## 2014-03-13 ENCOUNTER — Emergency Department (HOSPITAL_COMMUNITY): Payer: BC Managed Care – PPO

## 2014-03-13 LAB — CBC WITH DIFFERENTIAL/PLATELET
Basophils Absolute: 0 10*3/uL (ref 0.0–0.1)
Basophils Relative: 0 % (ref 0–1)
EOS PCT: 1 % (ref 0–5)
Eosinophils Absolute: 0.1 10*3/uL (ref 0.0–0.7)
HEMATOCRIT: 40.9 % (ref 36.0–46.0)
Hemoglobin: 13.4 g/dL (ref 12.0–15.0)
LYMPHS PCT: 28 % (ref 12–46)
Lymphs Abs: 2.9 10*3/uL (ref 0.7–4.0)
MCH: 29.6 pg (ref 26.0–34.0)
MCHC: 32.8 g/dL (ref 30.0–36.0)
MCV: 90.3 fL (ref 78.0–100.0)
MONO ABS: 0.6 10*3/uL (ref 0.1–1.0)
Monocytes Relative: 6 % (ref 3–12)
NEUTROS ABS: 7 10*3/uL (ref 1.7–7.7)
Neutrophils Relative %: 65 % (ref 43–77)
Platelets: 331 10*3/uL (ref 150–400)
RBC: 4.53 MIL/uL (ref 3.87–5.11)
RDW: 12.9 % (ref 11.5–15.5)
WBC: 10.7 10*3/uL — ABNORMAL HIGH (ref 4.0–10.5)

## 2014-03-13 LAB — COMPREHENSIVE METABOLIC PANEL
ALT: 11 U/L (ref 0–35)
AST: 13 U/L (ref 0–37)
Albumin: 3.7 g/dL (ref 3.5–5.2)
Alkaline Phosphatase: 70 U/L (ref 39–117)
Anion gap: 12 (ref 5–15)
BUN: 11 mg/dL (ref 6–23)
CO2: 25 meq/L (ref 19–32)
CREATININE: 0.83 mg/dL (ref 0.50–1.10)
Calcium: 9.4 mg/dL (ref 8.4–10.5)
Chloride: 100 mEq/L (ref 96–112)
GFR calc Af Amer: >90 mL/min (ref >90)
GFR calc non Af Amer: >90 mL/min (ref >90)
GLUCOSE: 101 mg/dL — AB (ref 70–99)
Potassium: 3.6 mEq/L — ABNORMAL LOW (ref 3.7–5.3)
SODIUM: 137 meq/L (ref 137–147)
Total Bilirubin: 0.3 mg/dL (ref 0.3–1.2)
Total Protein: 8 g/dL (ref 6.0–8.3)

## 2014-03-13 LAB — LIPASE, BLOOD: LIPASE: 94 U/L — AB (ref 11–59)

## 2014-03-13 LAB — I-STAT BETA HCG BLOOD, ED (MC, WL, AP ONLY): I-stat hCG, quantitative: <5 m[IU]/mL (ref <5)

## 2014-03-13 MED ORDER — HYDROMORPHONE HCL 1 MG/ML IJ SOLN
1.0000 mg | Freq: Once | INTRAMUSCULAR | Status: AC
  Administered 2014-03-13: 1 mg via INTRAVENOUS
  Filled 2014-03-13: qty 1

## 2014-03-13 MED ORDER — SODIUM CHLORIDE 0.9 % IV BOLUS (SEPSIS)
1000.0000 mL | Freq: Once | INTRAVENOUS | Status: AC
  Administered 2014-03-13: 1000 mL via INTRAVENOUS

## 2014-03-13 MED ORDER — HYDROMORPHONE HCL 1 MG/ML IJ SOLN
0.5000 mg | Freq: Once | INTRAMUSCULAR | Status: AC
Start: 2014-03-13 — End: 2014-03-13
  Administered 2014-03-13: 0.5 mg via INTRAVENOUS
  Filled 2014-03-13: qty 1

## 2014-03-13 MED ORDER — ONDANSETRON 8 MG PO TBDP: 8.0000 mg | ORAL_TABLET | Freq: Three times a day (TID) | ORAL | Status: DC | PRN

## 2014-03-13 MED ORDER — ONDANSETRON HCL 4 MG/2ML IJ SOLN
4.0000 mg | Freq: Once | INTRAMUSCULAR | Status: AC
  Administered 2014-03-13: 4 mg via INTRAVENOUS
  Filled 2014-03-13: qty 2

## 2014-03-13 MED ORDER — OXYCODONE-ACETAMINOPHEN 5-325 MG PO TABS: 1.0000 | ORAL_TABLET | ORAL | Status: DC | PRN

## 2014-03-13 NOTE — ED Provider Notes (Signed)
CSN: [id number]     Arrival date & time 03/12/14  2339 History   First MD Initiated Contact with Patient 03/13/14 0007     Chief Complaint  Patient presents with  . Abdominal Pain  . Flank Pain      HPI Patient presents to the emergency department complaining of severe acute onset epigastric and right upper quadrant abdominal pain with associated nausea and vomiting.  No urinary complaints.  No fevers or chills.  Was in her normal state of health earlier.  She's had symptoms like this before is told that she had gallbladder sludge.  She saw a Development worker, international aidgeneral surgeon and they do not think the patient would benefit from cholecystectomy at that time secondary to a normal HIDA scan.  Patient denies chest pain.  No cough or congestion.    Past Medical History  Diagnosis Date  . Dysmenorrhea   . PCOS (polycystic ovarian syndrome)   . Gallbladder attack    Past Surgical History  Procedure Laterality Date  . Trigger finger release     Family History  Problem Relation Age of Onset  . Hyperlipidemia Father   . Cancer Paternal Grandmother     breast   History  Substance Use Topics  . Smoking status: Never Smoker   . Smokeless tobacco: Never Used  . Alcohol Use: No     Comment: 3-5 times/year.   OB History    No data available     Review of Systems  All other systems reviewed and are negative.     Allergies  Review of patient's allergies indicates no known allergies.  Home Medications   Prior to Admission medications   Medication Sig Start Date End Date Taking? Authorizing Provider  Mefenamic Acid (PONSTEL PO) Take 1 capsule by mouth as needed (for pain.).    Yes Historical Provider, MD  Multiple Vitamin (MULTIVITAMIN) tablet Take 1 tablet by mouth daily.   Yes Historical Provider, MD  norgestimate-ethinyl estradiol (ORTHO-CYCLEN,SPRINTEC,PREVIFEM) 0.25-35 MG-MCG tablet Take 1 tablet by mouth daily.   Yes Historical Provider, MD  metFORMIN (GLUCOPHAGE) 500 MG tablet Take 500 mg  by mouth 2 (two) times daily with a meal.    Historical Provider, MD  ondansetron (ZOFRAN ODT) 4 MG disintegrating tablet 4mg  ODT q4 hours prn nausea/vomit 10/22/13   Loren Raceravid Yelverton, MD  oxyCODONE-acetaminophen (PERCOCET) 5-325 MG per tablet Take 2 tablets by mouth every 4 (four) hours as needed. 10/22/13   Loren Raceravid Yelverton, MD   BP 145/104 mmHg  Pulse 124  Temp(Src) 98.2 F (36.8 C) (Oral)  Resp 20  Ht 5' (1.524 m)  Wt 145 lb (65.772 kg)  BMI 28.32 kg/m2  SpO2 100%  LMP 02/26/2014 Physical Exam  Constitutional: She is oriented to person, place, and time. She appears well-developed and well-nourished. No distress.  HENT:  Head: Normocephalic and atraumatic.  Eyes: EOM are normal.  Neck: Normal range of motion.  Cardiovascular: Normal rate, regular rhythm and normal heart sounds.   Pulmonary/Chest: Effort normal and breath sounds normal.  Abdominal: Soft. She exhibits no distension.  Mild epigastric and RUQ tenderness without guarding or rebound  Musculoskeletal: Normal range of motion.  Neurological: She is alert and oriented to person, place, and time.  Skin: Skin is warm and dry.  Psychiatric: She has a normal mood and affect. Judgment normal.  Nursing note and vitals reviewed.   ED Course  Procedures (including critical care time) Labs Review Labs Reviewed  CBC WITH DIFFERENTIAL - Abnormal; Notable for the following:  WBC 10.7 (*)    All other components within normal limits  COMPREHENSIVE METABOLIC PANEL - Abnormal; Notable for the following:    Potassium 3.6 (*)    Glucose, Bld 101 (*)    All other components within normal limits  LIPASE, BLOOD - Abnormal; Notable for the following:    Lipase 94 (*)    All other components within normal limits  I-STAT BETA HCG BLOOD, ED ([facility], [facility], [facility] ONLY)    Imaging Review [place] Abdomen Complete  03/13/2014   CLINICAL DATA:  Right upper quadrant pain. History of gallbladder polyps.  EXAM: ULTRASOUND ABDOMEN COMPLETE  COMPARISON:   10/22/2013  FINDINGS: Gallbladder: Multiple echogenic foci demonstrated along the gallbladder wall including the nondependent portion with some ring down artifact. No shadowing. Changes are consistent with gallbladder polyps. Small amount of sludge demonstrated in the dependent portion of the gallbladder. No discrete stones. Appearance is similar to previous study. No gallbladder wall thickening. Murphy's sign is negative.  Common bile duct: Diameter: 2 mm, normal  Liver: No focal lesion identified. Within normal limits in parenchymal echogenicity.  IVC: No abnormality visualized.  Pancreas: Visualized portion unremarkable.  Spleen: Size and appearance within normal limits.  Right Kidney: Length: 8.6 cm. Echogenicity within normal limits. No mass or hydronephrosis visualized.  Left Kidney: Length: 9.7 cm. Echogenicity within normal limits. No mass or hydronephrosis visualized.  Abdominal aorta: No aneurysm visualized.  Other findings: None.  IMPRESSION: Unchanged appearance of multiple gallbladder polyps. Mild gallbladder sludge. Examination is otherwise normal.   Electronically Signed   By: Burman NievesWilliam  Stevens M.D.   On: 03/13/2014 01:31  I personally reviewed the imaging tests through PACS system I reviewed available ER/hospitalization records through the EMR    EKG Interpretation None      MDM   Final diagnoses:  RUQ abdominal pain    Mild elevation of the patient's lipase.  This is very nonspecific.  Gallbladder sludge.  Pain controlled.  No signs of cholecystitis.  She has seen general surgery in the past and they do not think that she'll benefit from cholecystectomy.  I will refer her back to her GI doctor.  I still suspect that she may benefit from cholecystectomy but will allow her GI doctor to refer her to general surgery.    Lyanne CoKevin M Bryannah Boston, MD 03/13/14 [phone]

## 2014-05-05 ENCOUNTER — Ambulatory Visit (INDEPENDENT_AMBULATORY_CARE_PROVIDER_SITE_OTHER): Payer: BC Managed Care – PPO | Admitting: Internal Medicine

## 2014-05-05 ENCOUNTER — Encounter: Payer: Self-pay | Admitting: Internal Medicine

## 2014-05-05 VITALS — BP 104/58 | HR 97 | Temp 98.3°F | Resp 12 | Ht 61.0 in | Wt 142.8 lb

## 2014-05-05 DIAGNOSIS — E785 Hyperlipidemia, unspecified: Secondary | ICD-10-CM

## 2014-05-05 DIAGNOSIS — E282 Polycystic ovarian syndrome: Secondary | ICD-10-CM | POA: Insufficient documentation

## 2014-05-05 LAB — COMPREHENSIVE METABOLIC PANEL
ALK PHOS: 61 U/L (ref 39–117)
ALT: 10 U/L (ref 0–35)
AST: 10 U/L (ref 0–37)
Albumin: 3.5 g/dL (ref 3.5–5.2)
BUN: 12 mg/dL (ref 6–23)
CO2: 28 mEq/L (ref 19–32)
Calcium: 9.2 mg/dL (ref 8.4–10.5)
Chloride: 106 mEq/L (ref 96–112)
Creatinine, Ser: 0.8 mg/dL (ref 0.4–1.2)
GFR: 93.48 mL/min (ref >60.00)
Glucose, Bld: 79 mg/dL (ref 70–99)
Potassium: 4.2 mEq/L (ref 3.5–5.1)
Sodium: 139 mEq/L (ref 135–145)
Total Bilirubin: 0.3 mg/dL (ref 0.2–1.2)
Total Protein: 7.3 g/dL (ref 6.0–8.3)

## 2014-05-05 LAB — LIPID PANEL
CHOL/HDL RATIO: 3
CHOLESTEROL: 187 mg/dL (ref 0–200)
HDL: 54 mg/dL (ref >39.00)
LDL Cholesterol: 104 mg/dL — ABNORMAL HIGH (ref 0–99)
NonHDL: 133
TRIGLYCERIDES: 147 mg/dL (ref 0.0–149.0)
VLDL: 29.4 mg/dL (ref 0.0–40.0)

## 2014-05-05 LAB — HEMOGLOBIN A1C: HEMOGLOBIN A1C: 5.5 % (ref 4.6–6.5)

## 2014-05-05 LAB — TSH: TSH: 0.74 u[IU]/mL (ref 0.35–4.50)

## 2014-05-05 LAB — VITAMIN D 25 HYDROXY (VIT D DEFICIENCY, FRACTURES): VITD: 17.82 ng/mL — ABNORMAL LOW (ref 30.00–100.00)

## 2014-05-05 LAB — T4, FREE: Free T4: 0.59 ng/dL — ABNORMAL LOW (ref 0.60–1.60)

## 2014-05-05 LAB — T3, FREE: T3, Free: 2.8 pg/mL (ref 2.3–4.2)
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
## 2014-05-05 NOTE — Patient Instructions (Signed)
Please stop at the lab. Please try to join MyChart for easier communication. Please come back for a follow-up appointment in 6 months.  Polycystic Ovarian Syndrome Polycystic ovarian syndrome (PCOS) is a common hormonal disorder among women of reproductive age. Most women with PCOS grow many small cysts on their ovaries. PCOS can cause problems with your periods and make it difficult to get pregnant. It can also cause an increased risk of miscarriage with pregnancy. If left untreated, PCOS can lead to serious health problems, such as diabetes and heart disease. CAUSES The cause of PCOS is not fully understood, but genetics may be a factor. SIGNS AND SYMPTOMS   Infrequent or no menstrual periods.   Inability to get pregnant (infertility) because of not ovulating.   Increased growth of hair on the face, chest, stomach, back, thumbs, thighs, or toes.   Acne, oily skin, or dandruff.   Pelvic pain.   Weight gain or obesity, usually carrying extra weight around the waist.   Type 2 diabetes.   High cholesterol.   High blood pressure.   Female-pattern baldness or thinning hair.   Patches of thickened and dark brown or black skin on the neck, arms, breasts, or thighs.   Tiny excess flaps of skin (skin tags) in the armpits or neck area.   Excessive snoring and having breathing stop at times while asleep (sleep apnea).   Deepening of the voice.   Gestational diabetes when pregnant.  DIAGNOSIS  There is no single test to diagnose PCOS.   Your health care provider will:   Take a medical history.   Perform a pelvic exam.   Have ultrasonography done.   Check your female and female hormone levels.   Measure glucose or sugar levels in the blood.   Do other blood tests.   If you are producing too many female hormones, your health care provider will make sure it is from PCOS. At the physical exam, your health care provider will want to evaluate the areas of  increased hair growth. Try to allow natural hair growth for a few days before the visit.   During a pelvic exam, the ovaries may be enlarged or swollen because of the increased number of small cysts. This can be seen more easily by using vaginal ultrasonography or screening to examine the ovaries and lining of the uterus (endometrium) for cysts. The uterine lining may become thicker if you have not been having a regular period.  TREATMENT  Because there is no cure for PCOS, it needs to be managed to prevent problems. Treatments are based on your symptoms. Treatment is also based on whether you want to have a baby or whether you need contraception.  Treatment may include:   Progesterone hormone to start a menstrual period.   Birth control pills to make you have regular menstrual periods.   Medicines to make you ovulate, if you want to get pregnant.   Medicines to control your insulin.   Medicine to control your blood pressure.   Medicine and diet to control your high cholesterol and triglycerides in your blood.  Medicine to reduce excessive hair growth.  Surgery, making small holes in the ovary, to decrease the amount of female hormone production. This is done through a long, lighted tube (laparoscope) placed into the pelvis through a tiny incision in the lower abdomen.  HOME CARE INSTRUCTIONS  Only take over-the-counter or prescription medicine as directed by your health care provider.  Pay attention to the foods you  eat and your activity levels. This can help reduce the effects of PCOS.  Keep your weight under control.  Eat foods that are low in carbohydrate and high in fiber.  Exercise regularly. SEEK MEDICAL CARE IF:  Your symptoms do not get better with medicine.  You have new symptoms. Document Released: 08/18/2004 Document Revised: 02/12/2013 Document Reviewed: 10/10/2012 [facility] Patient Information 2015 [place], [place]. This information is not intended to replace  advice given to you by your health care provider. Make sure you discuss any questions you have with your health care provider.

## 2014-05-06 LAB — TESTOSTERONE, FREE, TOTAL, SHBG
Sex Hormone Binding: 146 nmol/L — ABNORMAL HIGH (ref 18–114)
Testosterone, Free: 1.9 pg/mL (ref 0.6–6.8)
Testosterone-% Free: 0.6 % (ref 0.4–2.4)
Testosterone: 32.3 ng/dL (ref 10–70)

## 2014-05-06 LAB — PROLACTIN: PROLACTIN: 12.6 ng/mL

## 2014-05-06 MED ORDER — VITAMIN D (ERGOCALCIFEROL) 1.25 MG (50000 UNIT) PO CAPS: 50000.0000 [IU] | ORAL_CAPSULE | ORAL | Status: DC

## 2014-05-07 ENCOUNTER — Encounter: Payer: Self-pay | Admitting: *Deleted

## 2014-11-10 ENCOUNTER — Ambulatory Visit: Payer: BC Managed Care – PPO | Admitting: Internal Medicine

## 2014-11-16 ENCOUNTER — Ambulatory Visit: Payer: Self-pay | Admitting: Internal Medicine

## 2014-11-20 ENCOUNTER — Ambulatory Visit (INDEPENDENT_AMBULATORY_CARE_PROVIDER_SITE_OTHER): Payer: BLUE CROSS/BLUE SHIELD | Admitting: Internal Medicine

## 2014-11-20 VITALS — BP 108/60 | HR 90 | Temp 98.3°F | Resp 12 | Wt 152.4 lb

## 2014-11-20 DIAGNOSIS — E282 Polycystic ovarian syndrome: Secondary | ICD-10-CM | POA: Diagnosis not present

## 2014-11-20 DIAGNOSIS — E559 Vitamin D deficiency, unspecified: Secondary | ICD-10-CM | POA: Diagnosis not present

## 2014-11-20 LAB — VITAMIN D 25 HYDROXY (VIT D DEFICIENCY, FRACTURES): VITD: 18.22 ng/mL — ABNORMAL LOW (ref 30.00–100.00)

## 2014-11-20 LAB — HEMOGLOBIN A1C: HEMOGLOBIN A1C: 5.2 % (ref 4.6–6.5)

## 2014-11-20 NOTE — Progress Notes (Signed)
Patient ID: Priscilla Rodriguez, female   DOB: March 30, 1986, 29 y.o.   MRN: [id number]  HPI: Priscilla Rodriguez is a 29 y.o. female, returning for follow-up for PCOS. Last visit 6 months ago.  Reviewed and addended hx: She was dx with PCOS by ObGyn in 2014. At that time, she had irregular menses and pain during intercourse. She had a TV U/S >> multiple bilateral ovarian cysts. No hormonal w/u.  Weight gain: - fluctuating: 135-142, also was at 150 lbs - no steroid use - no weight loss meds - Diets tried: none - sees Denny Levy (nutrition) - Exercise:  Has sludge in her GB >> had 2 pancreatitis attacks. No h/o TG.  Fertility/Menstrual cycles: - irregular menses - + h/o ovarian cysts - children: 0 - miscarriages: 0 - contraception: OCP - she was on OCP for dysmenorrhea and menorrhagia from 29 y/o >> college >> stopped for 2 years >> dysmenorrhea and irregular menses >> several negative pregnancy tests >> tried NuvaRing in 2010 >> restarted OCPs in 2014: Jolessa, then [employer]. - has uterus didelphis (double uterus)  She stopped OCP in 08/2014 as she does not want to be on this for life >> 2 menstrual cycles since then. She is planning to get pregnant soon >> she does not think she is pregnant now >> refuses an UPT.  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 - plucks it  Treatments tried: - did not try Metformin - did not try Spironolactone - did not try Vaniqua  Other meds: - SSRIs: Cymbalta  - Last CMP:   Chemistry      Component Value Date/Time   NA 139 05/05/2014 1006   K 4.2 05/05/2014 1006   CL 106 05/05/2014 1006   CO2 28 05/05/2014 1006   BUN 12 05/05/2014 1006   CREATININE 0.8 05/05/2014 1006      Component Value Date/Time   CALCIUM 9.2 05/05/2014 1006   ALKPHOS 61 05/05/2014 1006   AST 10 05/05/2014 1006   ALT 10 05/05/2014 1006   BILITOT 0.3 05/05/2014 1006     Reviewed pertinent labs - on OCPs: Component     Latest Ref Rng  05/05/2014  Testosterone     10 - 70 ng/dL 40.9  Sex Hormone Binding     18 - 114 nmol/L 146 (H)  Testosterone Free     0.6 - 6.8 pg/mL 1.9  Testosterone-% Free     0.4 - 2.4 % 0.6  TSH     0.35 - 4.50 uIU/mL 0.74  Free T4     0.60 - 1.60 ng/dL 8.11 (L)  T3, Free     2.3 - 4.2 pg/mL 2.8  Prolactin      12.6    ROS: Constitutional: + weight gain, + fatigue, no subjective hyperthermia/hypothermia Eyes: no blurry vision, no xerophthalmia ENT: no sore throat, no nodules palpated in throat, no dysphagia/odynophagia, no hoarseness Cardiovascular: no CP/SOB/palpitations/leg swelling Respiratory: no cough/SOB Gastrointestinal: + N/no V/+ D/+ C/+ heartburn Musculoskeletal: no muscle/joint aches Skin: + acne, + hair on face (plucked) Neurological: no tremors/numbness/tingling/dizziness  I reviewed pt's medications, allergies, PMH, social hx, family hx, and changes were documented in the history of present illness. Otherwise, unchanged from my initial visit note:  Past Medical History  Diagnosis Date  . Dysmenorrhea   . PCOS (polycystic ovarian syndrome)   . Gallbladder attack    Past Surgical History  Procedure Laterality Date  . Trigger finger release  History   Social History  . Marital Status: Single    Spouse Name: N/A    Number of Children: 0   Occupational History  . receptionist   Social History Main Topics  . Smoking status: Never Smoker   . Smokeless tobacco: Never Used  . Alcohol Use: No     Comment: 3-5 times/year.  . Drug Use: No   Current Outpatient Prescriptions on File Prior to Visit  Medication Sig Dispense Refill  . Mefenamic Acid (PONSTEL PO) Take 1 capsule by mouth as needed (for pain.).     Marland Kitchen. Multiple Vitamin (MULTIVITAMIN) tablet Take 1 tablet by mouth daily.    . norgestimate-ethinyl estradiol (ORTHO-CYCLEN,SPRINTEC,PREVIFEM) 0.25-35 MG-MCG tablet Take 1 tablet by mouth daily.     No current facility-administered medications on file  prior to visit.   No Known Allergies Family History  Problem Relation Age of Onset  . Hyperlipidemia Father   . Cancer Paternal Grandmother     breast  - DM2 in PGM - HTN in F  PE: BP 108/60 mmHg  Pulse 90  Temp(Src) 98.3 F (36.8 C) (Oral)  Resp 12  SpO2 96% Wt Readings from Last 3 Encounters:  05/05/14 142 lb 12.8 oz (64.774 kg)  03/12/14 145 lb (65.772 kg)  10/22/13 147 lb 0.8 oz (66.701 kg)   Constitutional: overweight, in NAD, no full supraclavicular fat pads Eyes: PERRLA, EOMI, no exophthalmos ENT: moist mucous membranes, no thyromegaly, no cervical lymphadenopathy Cardiovascular: tachycardia, RR, No MRG Respiratory: CTA B Gastrointestinal: abdomen soft, NT, ND, BS+ Musculoskeletal: no deformities, strength intact in all 4 Skin: moist, warm; + few acne spots on chin, no dark terminal hair on chin, + vellum on sideburns, no  skin tags, no acanthosis nigricans, no purple, wide, stretch marks; large tattoo on L upper chest Neurological: no tremor with outstretched hands, DTR normal in all 4  ASSESSMENT: 1. PCOS  2. HL  3. Vit D def  PLAN: 1. PCOS Pt with polycystic ovaries and irregular menses >> PCOS (meets 2 of 3 Bethesda Criteria) I had a long discussion with the patient about PCOS at last visit. This is of sum of several conditions, including:  weight gain  insulin resistance (and therefore a higher risk of developing diabetes later in life)  acne  hirsutism  irregular menstrual cycles  decreased fertility. - We also discussed about the fact that the treatment is usually targeted to addressing the problem that concerns the patient the most: acne/hirsutism, weight gain, or fertility, but there is no single treatment for PCOS.  - She was on oral contraceptives (Ortho-Cyclene - this is associated with less DVT risk c/w other 3rd gen OCPs). She had a suppressed testosterone at last visit on OCPs. However, she stopped these since 08/2014 >> ot 2 menses. We  discussed that she will need to add them back if she does not have 4 or more menses a year. - she refused metformin (not eager to start this as she did not think she has insulin resistance. I agreed that this is not a mandatory med. In PCOS, but I explained that it is not only used for insulin resistance, but also for improving menses and for maintaining a pregnancy) - for now, will keep her off Sprintec, and she can think about Metformin and let me know. - for now, I would like to check: - testosterone - HbA1c (last 5.5%) - I will see her back in 6 mo.  2. H/o HL - she has  a h/o GB problems and h/o pancreatitis in the past - normal TG at last check  3. Vit D def - we started Ergocalciferol 50,000 IU weekly, then 2000 vit D3 IU daily + MVI - check vit D level today  Component     Latest Ref Rng 11/20/2014  Testosterone     10 - 70 ng/dL 48  Sex Hormone Binding     17 - 124 nmol/L 24  Testosterone Free     0.6 - 6.8 pg/mL 10.3 (H)  Testosterone-% Free     0.4 - 2.4 % 2.1  VITD     30.00 - 100.00 ng/mL 18.22 (L)  Hemoglobin A1C     4.6 - 6.5 % 5.2   Free testosterone has increased due to stopping OCPs. Hemoglobin A1c is normal. Vitamin D still very low, restart ergocalciferol 50,000 units weekly and remain on this dose until next visit.

## 2014-11-20 NOTE — Patient Instructions (Signed)
Please stop at the lab.  For now, continue to stay off OCPs, but let me know if you have <4 cycles a year.  Continue vitamin D 2000 units daily + MVI.  Please come back for a follow-up appointment in 6 months

## 2014-11-23 ENCOUNTER — Encounter: Payer: Self-pay | Admitting: Internal Medicine

## 2014-11-23 LAB — TESTOSTERONE, FREE, TOTAL, SHBG
SEX HORMONE BINDING: 24 nmol/L (ref 17–124)
TESTOSTERONE-% FREE: 2.1 % (ref 0.4–2.4)
Testosterone, Free: 10.3 pg/mL — ABNORMAL HIGH (ref 0.6–6.8)
Testosterone: 48 ng/dL (ref 10–70)

## 2014-11-23 MED ORDER — VITAMIN D (ERGOCALCIFEROL) 1.25 MG (50000 UNIT) PO CAPS: 50000.0000 [IU] | ORAL_CAPSULE | ORAL | Status: DC

## 2015-04-29 IMAGING — US US RENAL
1 series · 14 of 25 positions shown · non-contrast
Comparison: None.

CLINICAL DATA: Uterine didelphys. Evaluate for congenital renal
abnormality.

EXAM:
RENAL/URINARY TRACT ULTRASOUND COMPLETE

[Series 1: us renal · 30 acquisitions, 14 frames shown]
[im 1/30]
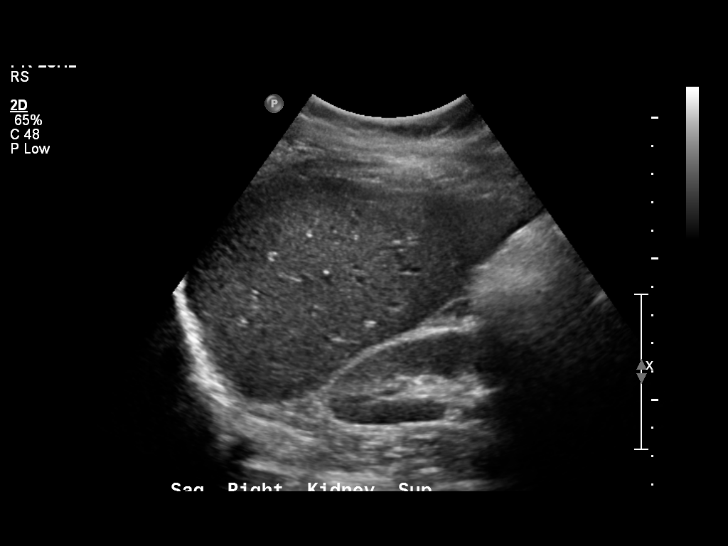
[im 3/30]
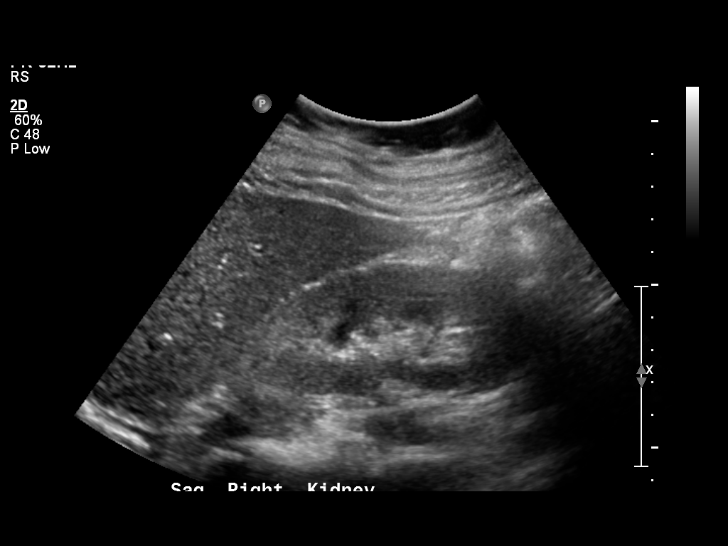
[im 5/30]
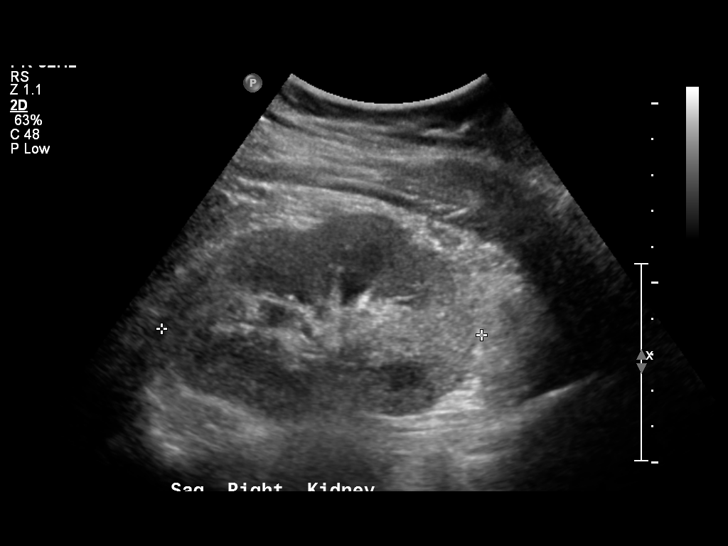
[im 8/30]
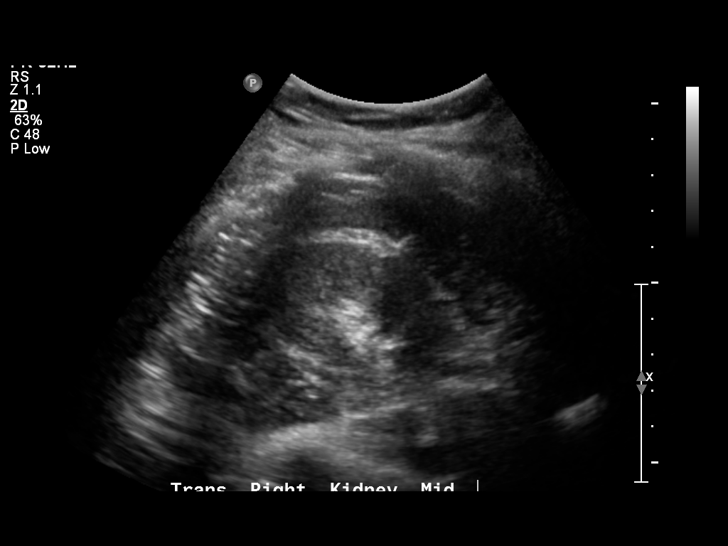
[im 10/30]
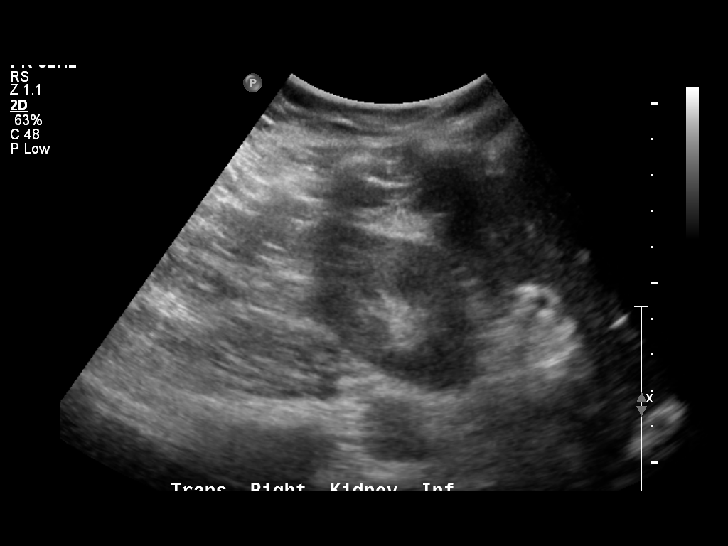
[im 11/30]
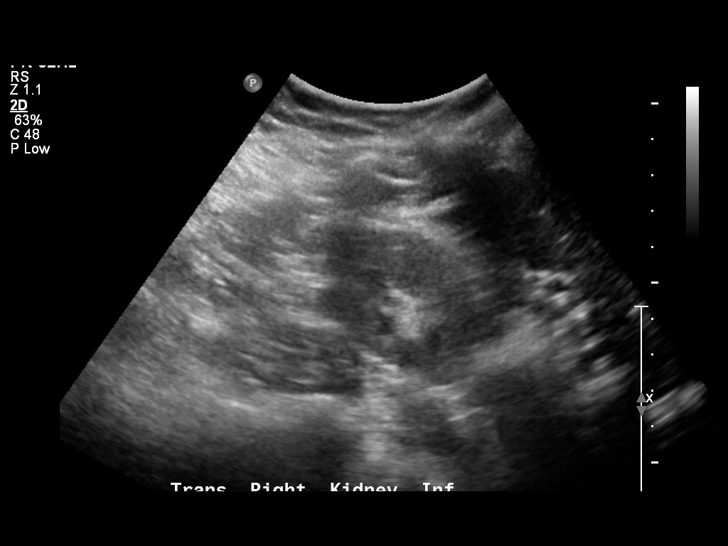
[im 14/30]
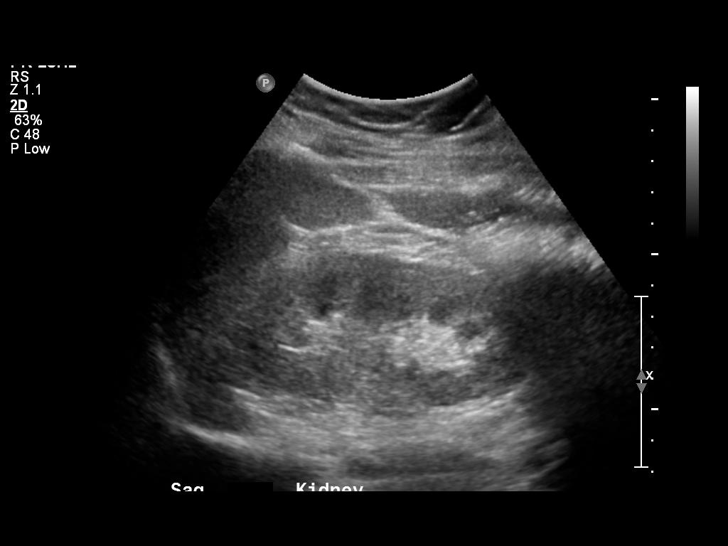
[im 16/30]
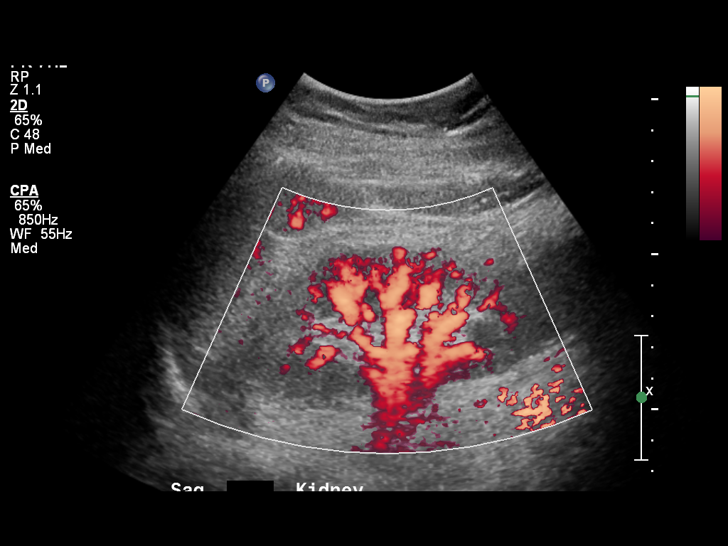
[im 19/30]
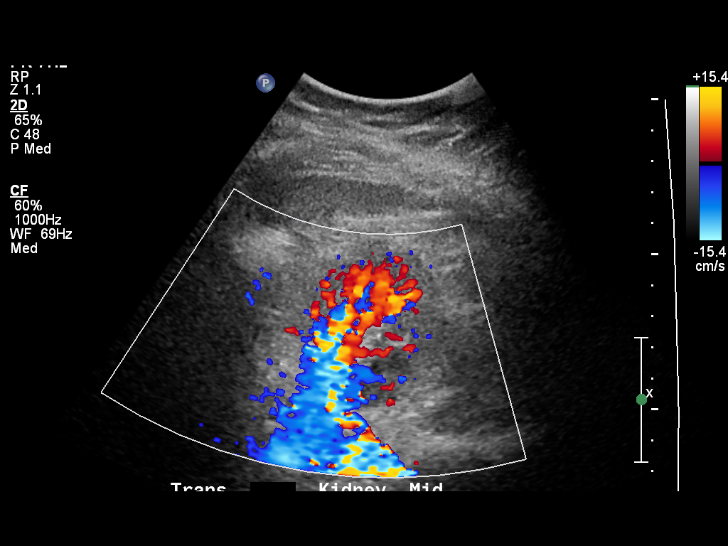
[im 20/30]
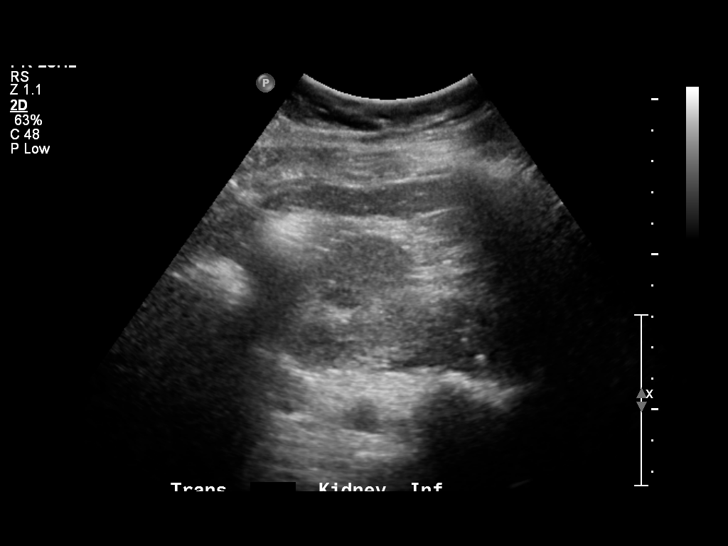
[im 22/30]
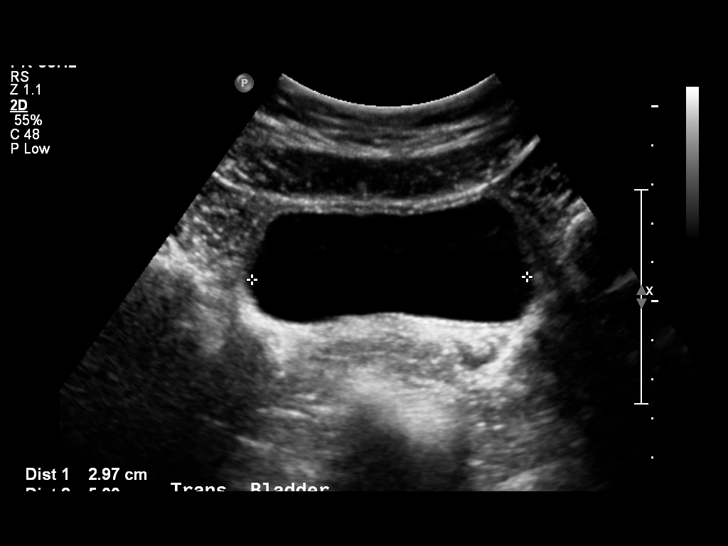
[im 25/30]
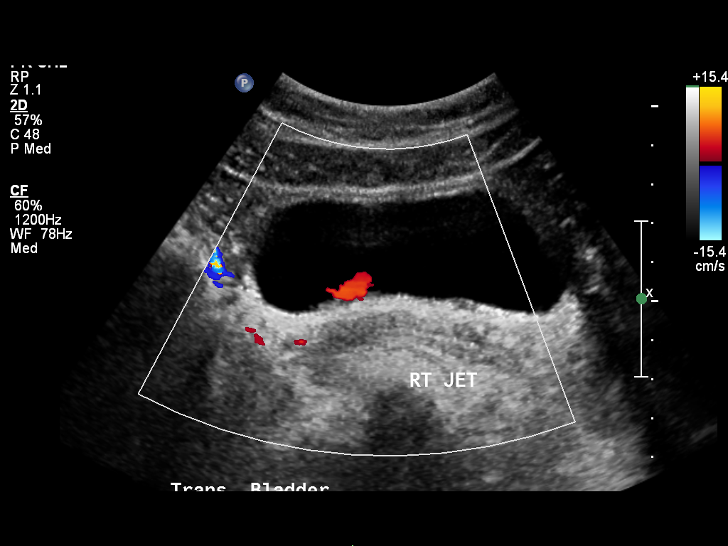
[im 27/30]
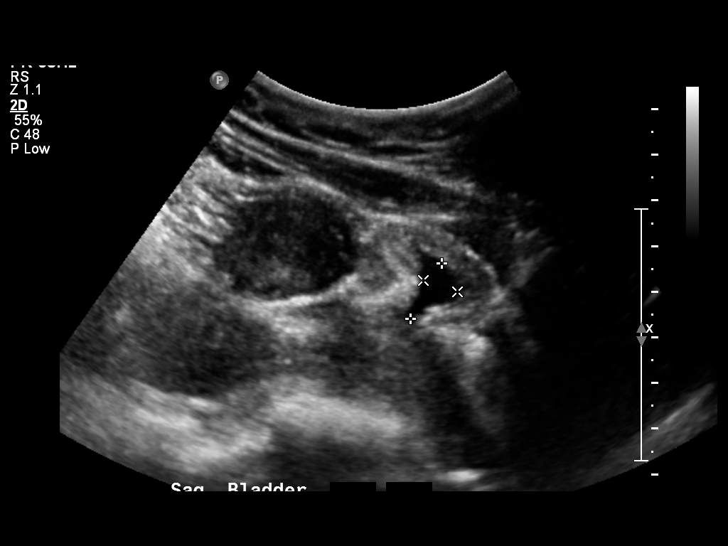
[im 30/30]
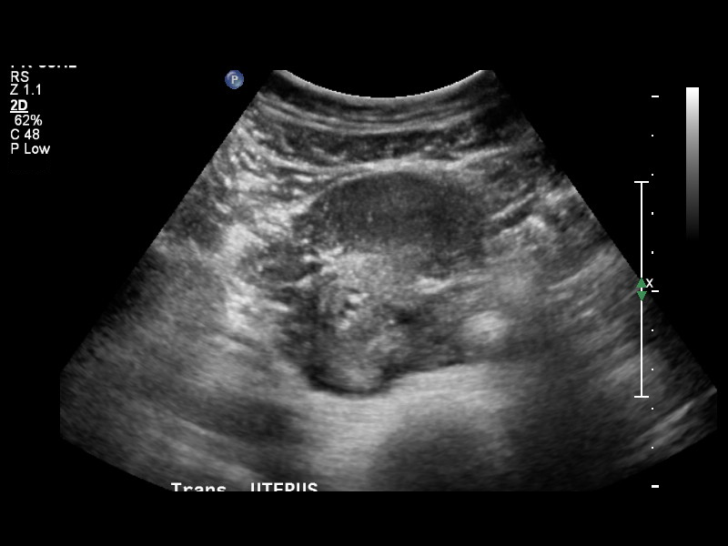

[14 of 25 positions shown; findings below may reference images not displayed]

FINDINGS: Right Kidney:

Length: 8.9 cm. Normal in location. Echogenicity within normal
limits. No mass or hydronephrosis visualized.

Left Kidney:

Length: 9.7 cm. Normal location. Echogenicity within normal limits.
No mass or hydronephrosis visualized.

Bladder:

Appears normal for degree of bladder distention.
IMPRESSION: Normal size and appearance of both kidneys. No renal anomaly or
other significant abnormality identified.

## 2015-05-24 ENCOUNTER — Ambulatory Visit: Payer: BLUE CROSS/BLUE SHIELD | Admitting: Internal Medicine

## 2015-08-03 IMAGING — US US ABDOMEN COMPLETE
1 series · 13 of 25 positions shown · non-contrast
Comparison: Renal ultrasound performed 07/18/2013, and abdominal
ultrasound performed 01/01/2012

CLINICAL DATA: Right upper quadrant abdominal pain.

EXAM:
ULTRASOUND ABDOMEN COMPLETE

[Series 1: us abdomen complete · 0.21mm/px · 13 of 171 slices shown]
[im 1/171]
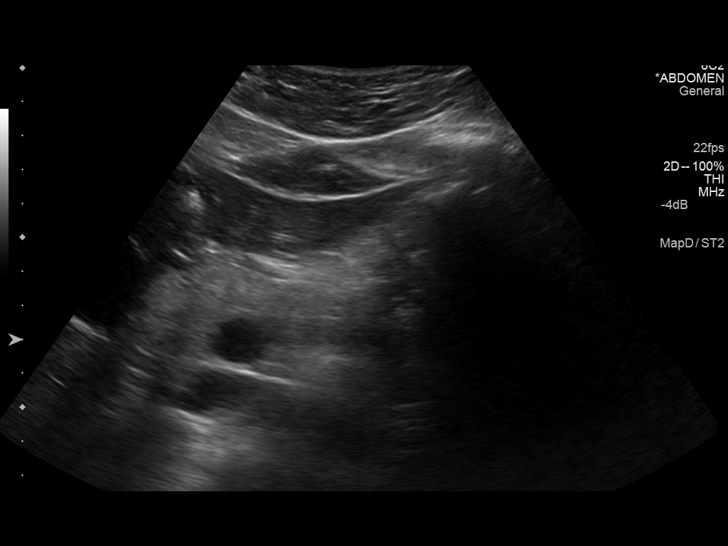
[im 15/171]
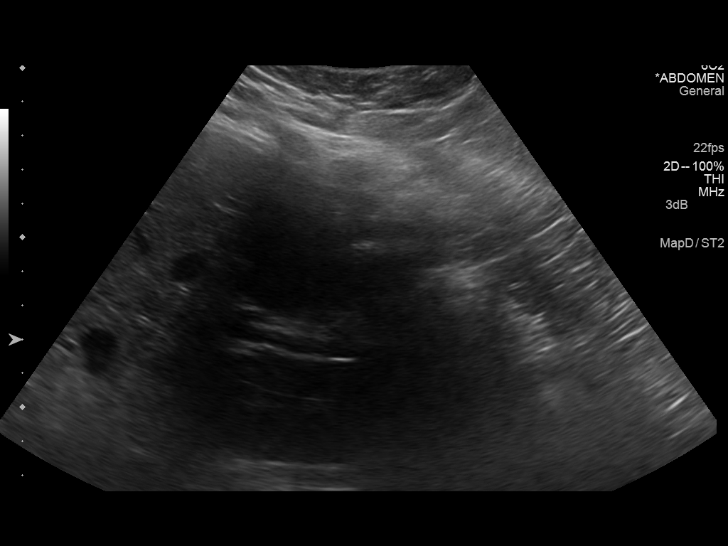
[im 29/171]
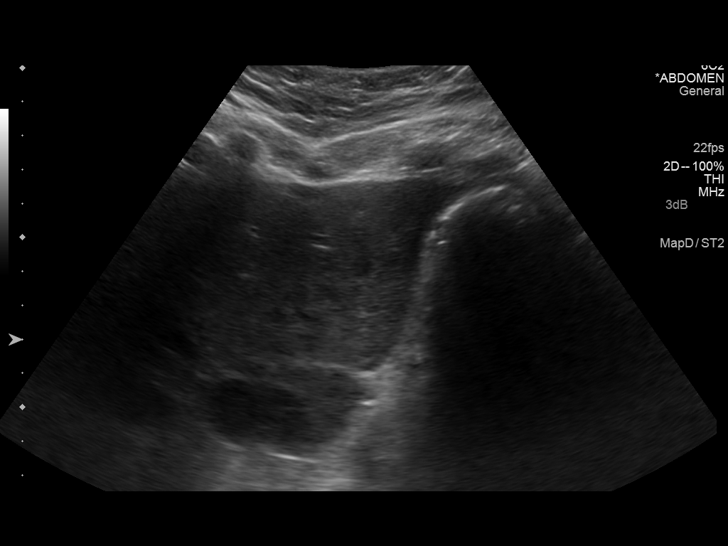
[im 43/171]
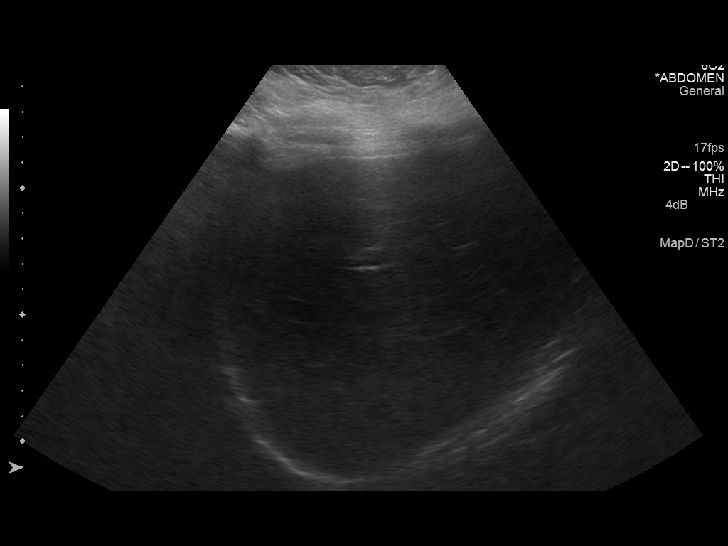
[im 57/171]
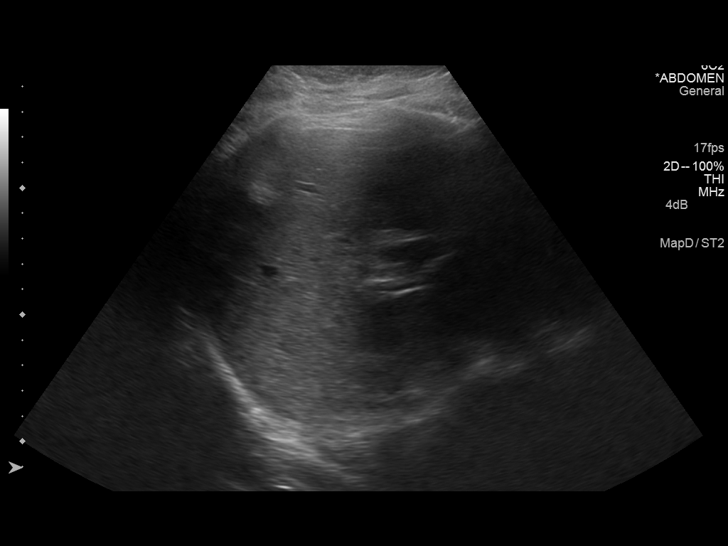
[im 71/171]
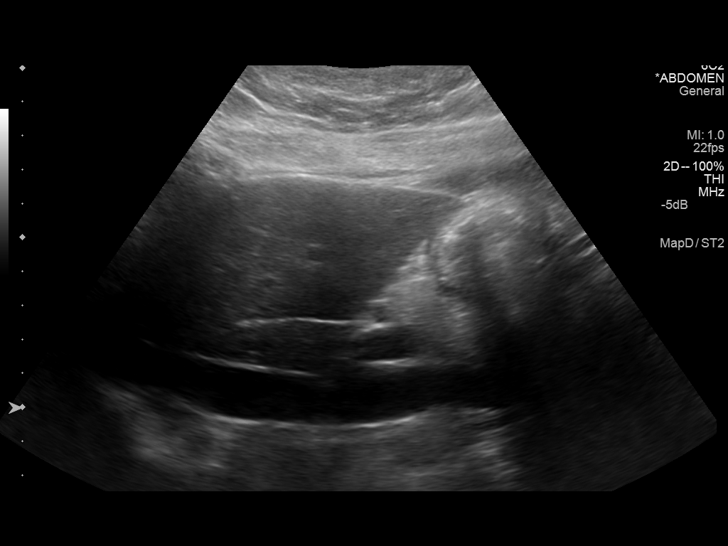
[im 86/171]
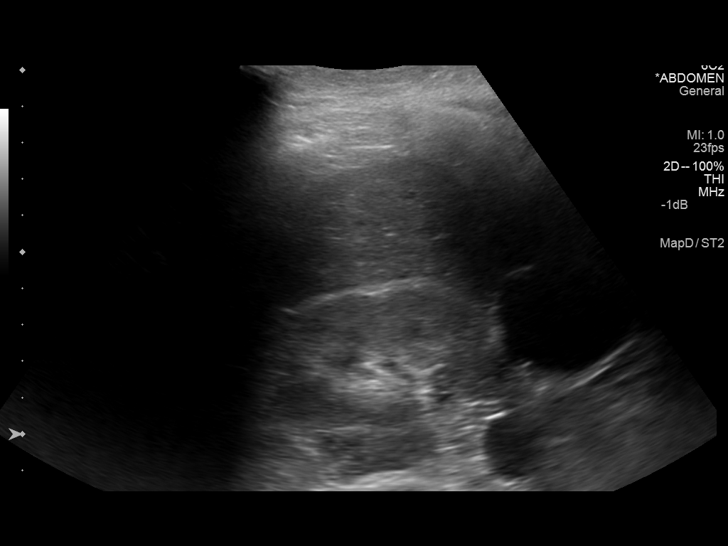
[im 100/171]
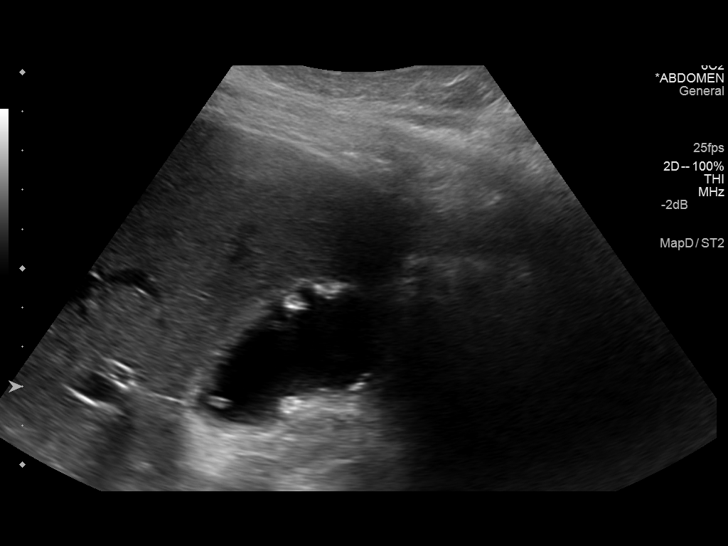
[im 114/171]
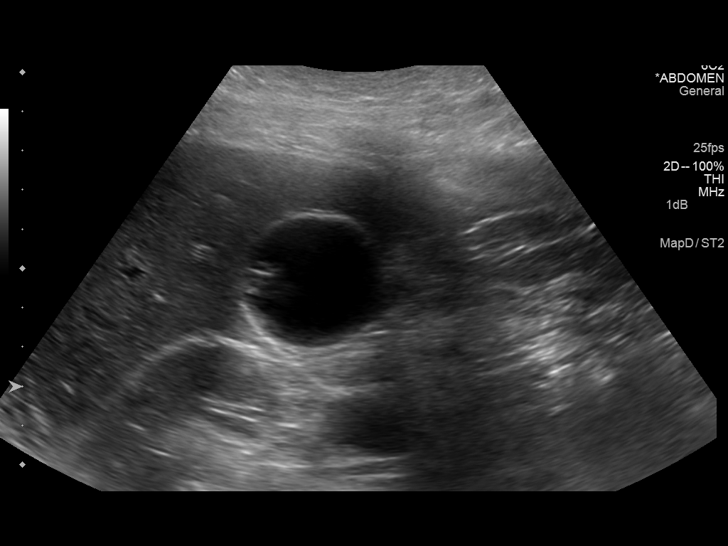
[im 128/171]
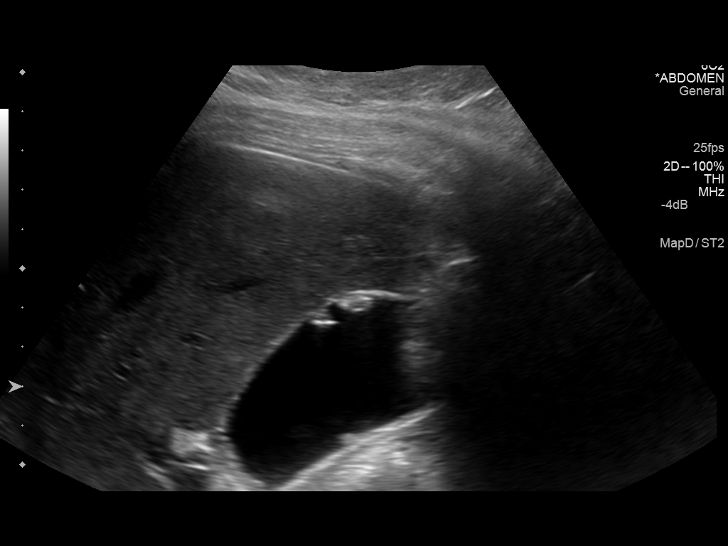
[im 142/171]
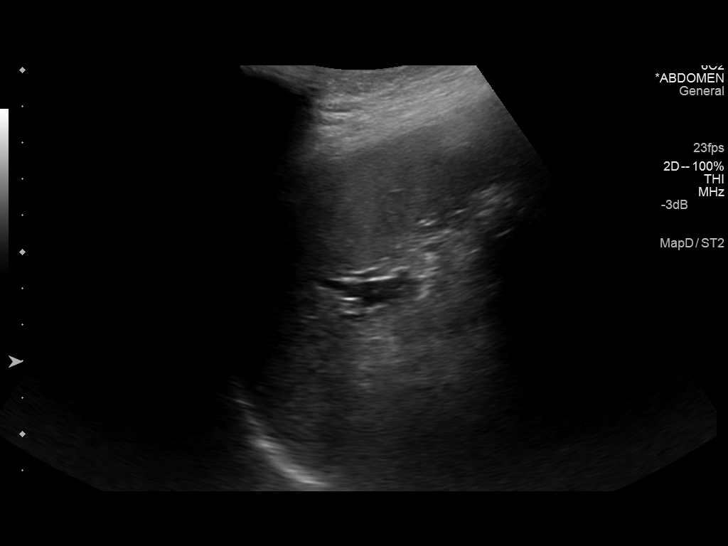
[im 156/171]
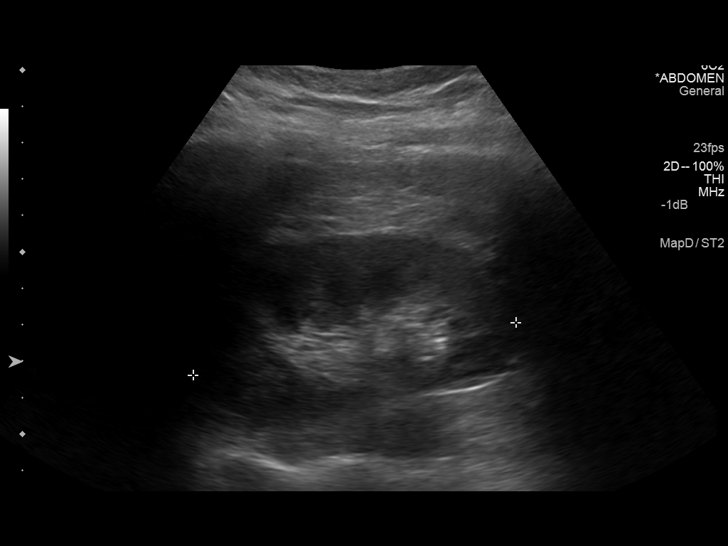
[im 171/171]
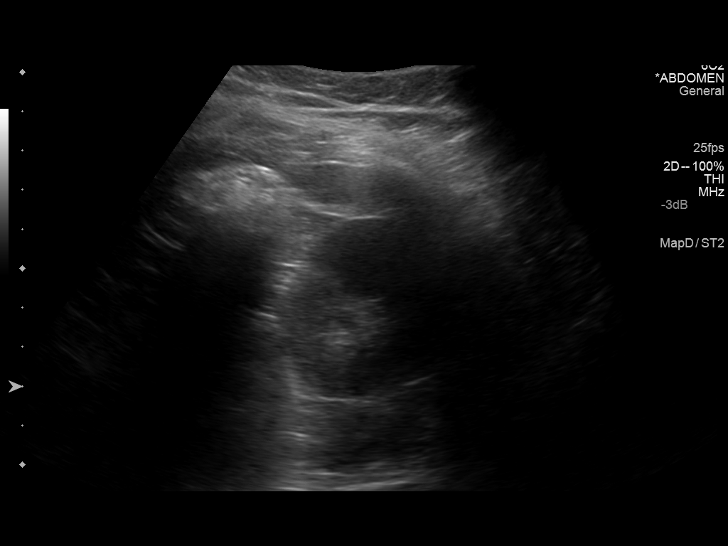

[13 of 25 positions shown; findings below may reference images not displayed]

FINDINGS: Gallbladder:

Scattered polyps of varying size are noted within the gallbladder,
measuring up to 7 mm. No definite stones are seen. The gallbladder
wall is borderline normal in thickness, without significant
pericholecystic fluid. Evaluation for ultrasonographic Murphy's sign
is equivocal given that the patient is on pain medication.

Common bile duct:

Diameter: 0.6 cm, within normal limits in caliber.

Liver:

No focal lesion identified. Within normal limits in parenchymal
echogenicity.

IVC:

No abnormality visualized.

Pancreas:

Visualized portion unremarkable.

Spleen:

Size and appearance within normal limits.

Right Kidney:

Length: 8.2 cm. Echogenicity within normal limits. No mass or
hydronephrosis visualized.

Left Kidney:

Length: 9.0 cm. Echogenicity within normal limits. No mass or
hydronephrosis visualized.

Abdominal aorta:

No aneurysm visualized.

Other findings:

None.
IMPRESSION: Gallbladder polyps again noted. No definite stones seen. Evaluation
for ultrasonographic Murphy sign is equivocal, since the patient is
on pain medication; no definite findings seen to suggest obstruction
or acute cholecystitis.

## 2015-12-23 IMAGING — US US ABDOMEN COMPLETE
1 series · 13 of 25 positions shown · non-contrast
Comparison: 10/22/2013

CLINICAL DATA: Right upper quadrant pain. History of gallbladder
polyps.

EXAM:
ULTRASOUND ABDOMEN COMPLETE

[Series 1: us abdomen complete · 0.22mm/px · 13 of 85 slices shown]
[im 1/85]
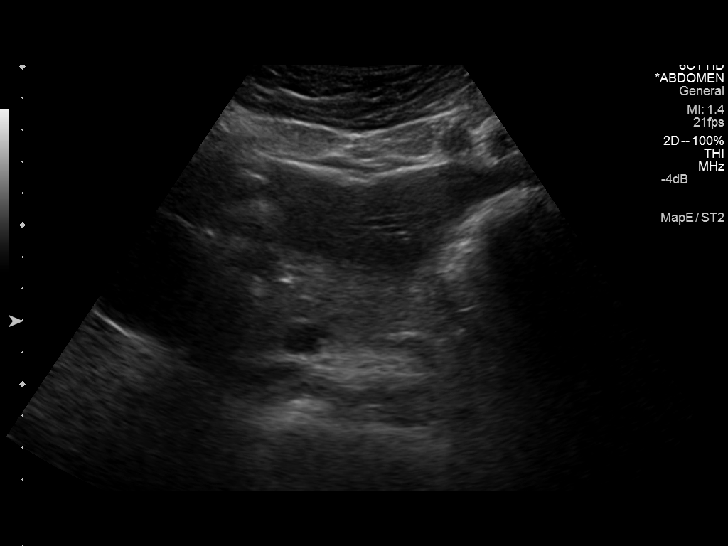
[im 8/85]
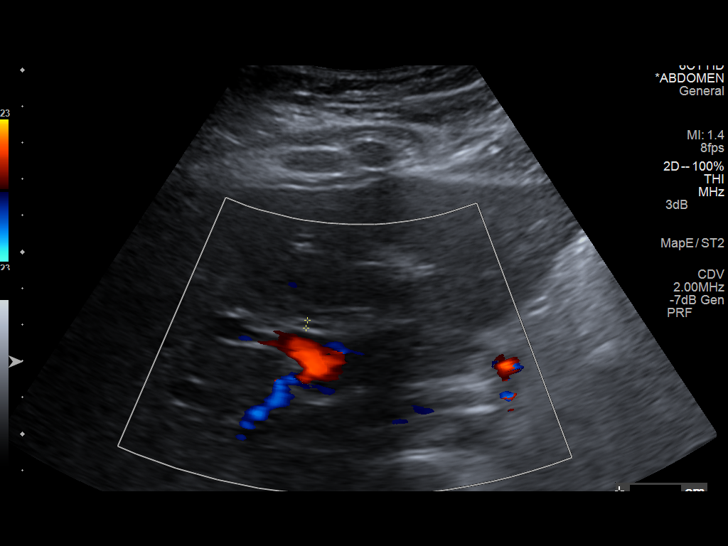
[im 15/85]
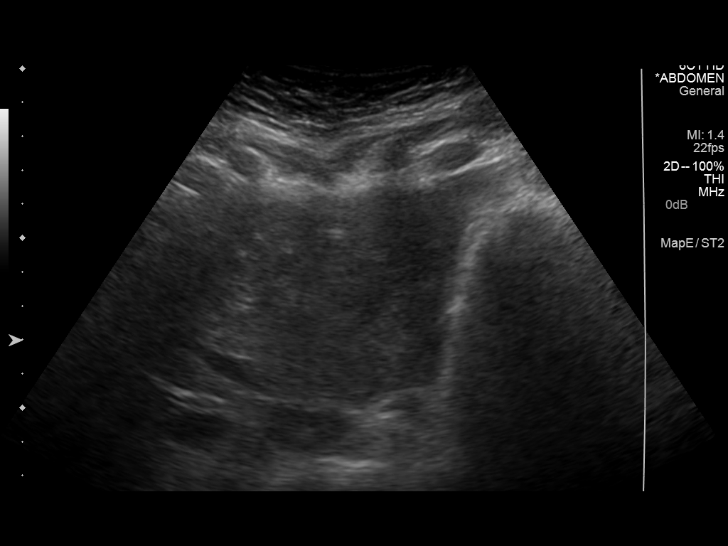
[im 22/85]
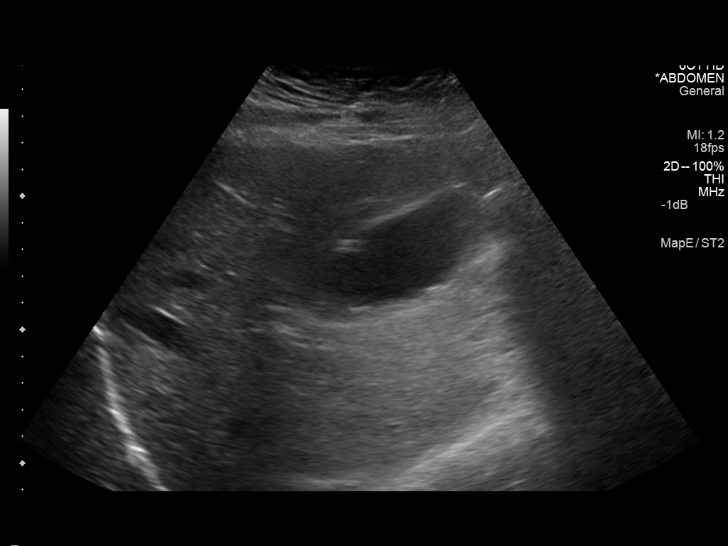
[im 29/85]
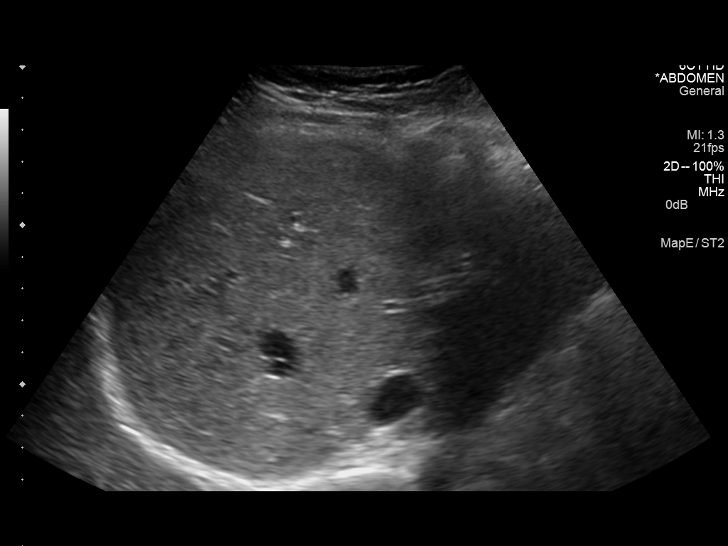
[im 36/85]
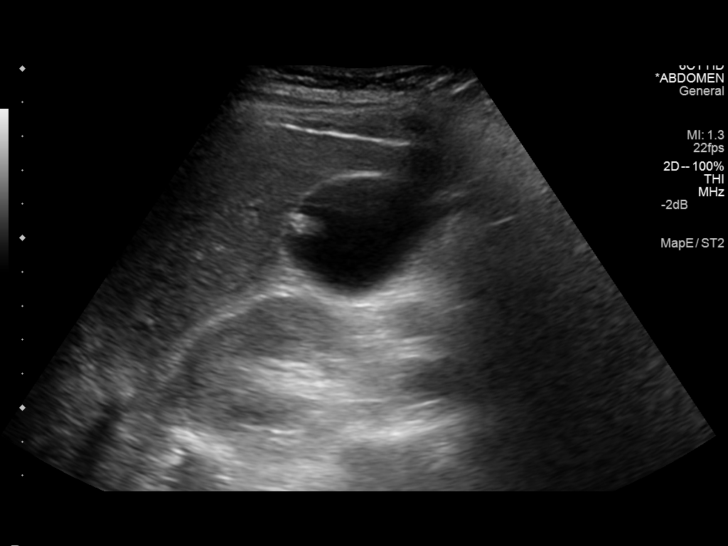
[im 43/85]
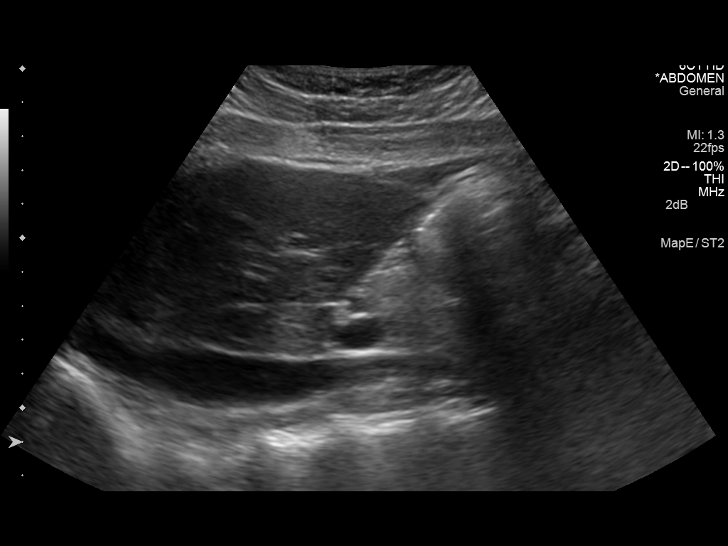
[im 50/85]
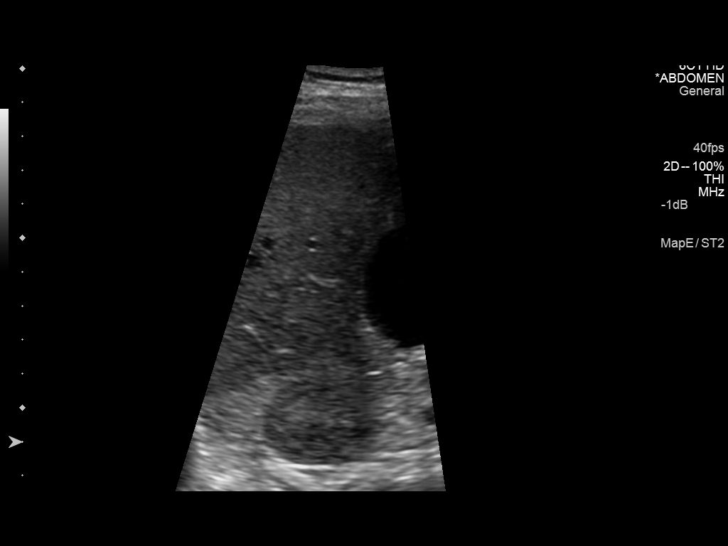
[im 57/85]
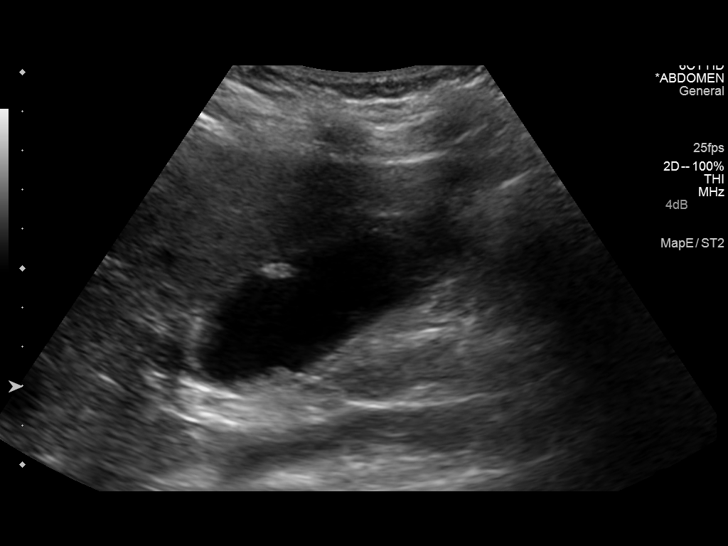
[im 64/85]
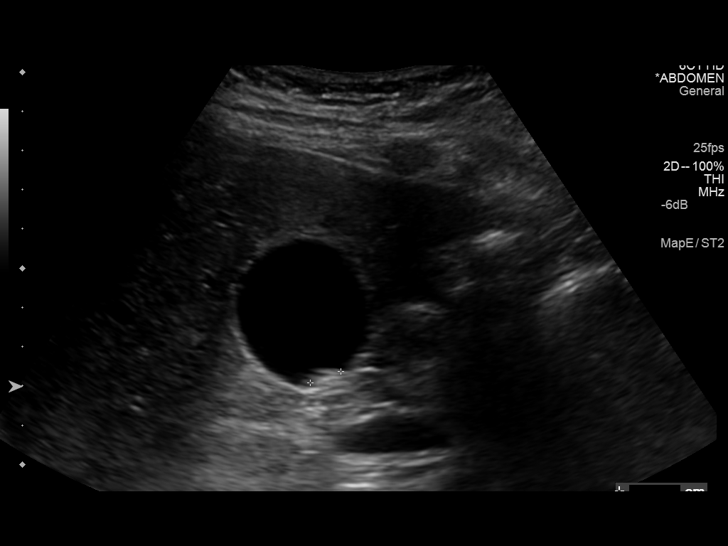
[im 71/85]
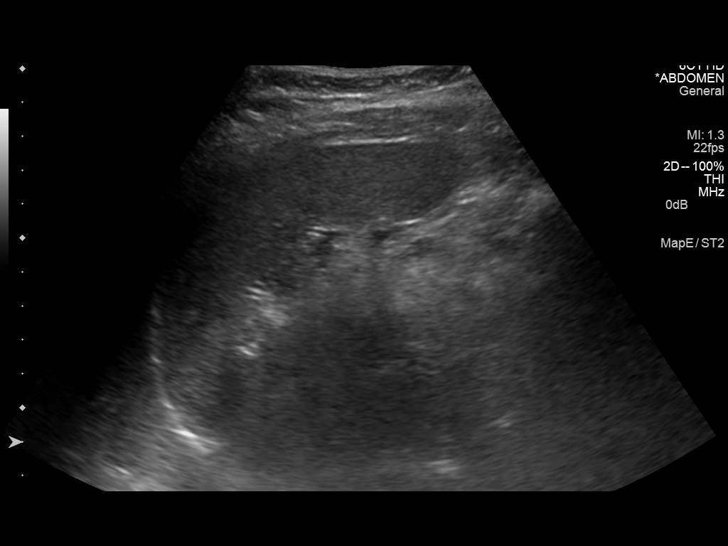
[im 78/85]
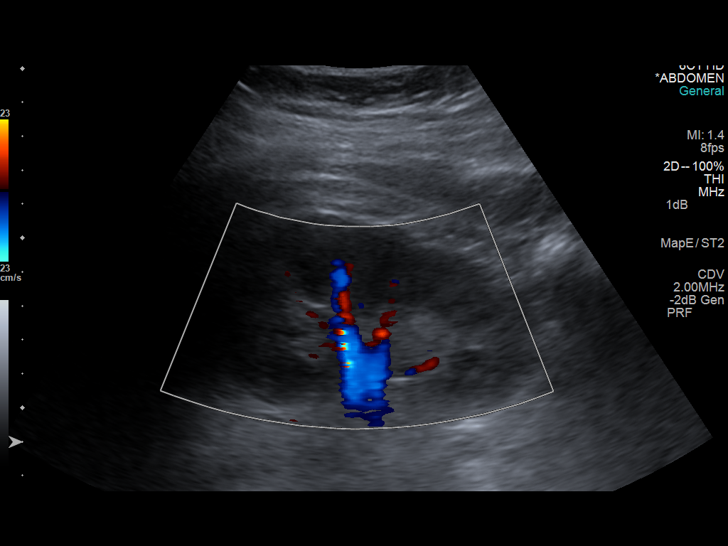
[im 85/85]
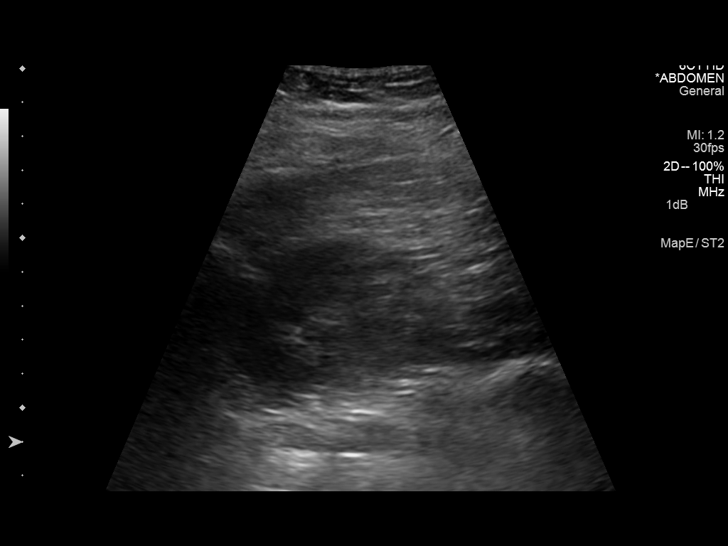

[13 of 25 positions shown; findings below may reference images not displayed]

FINDINGS: Gallbladder: Multiple echogenic foci demonstrated along the
gallbladder wall including the nondependent portion with some ring
down artifact. No shadowing. Changes are consistent with gallbladder
polyps. Small amount of sludge demonstrated in the dependent portion
of the gallbladder. No discrete stones. Appearance is similar to
previous study. No gallbladder wall thickening. Murphy's sign is
negative.

Common bile duct: Diameter: 2 mm, normal

Liver: No focal lesion identified. Within normal limits in
parenchymal echogenicity.

IVC: No abnormality visualized.

Pancreas: Visualized portion unremarkable.

Spleen: Size and appearance within normal limits.

Right Kidney: Length: 8.6 cm. Echogenicity within normal limits. No
mass or hydronephrosis visualized.

Left Kidney: Length: 9.7 cm. Echogenicity within normal limits. No
mass or hydronephrosis visualized.

Abdominal aorta: No aneurysm visualized.

Other findings: None.
IMPRESSION: Unchanged appearance of multiple gallbladder polyps. Mild
gallbladder sludge. Examination is otherwise normal.

## 2016-11-29 ENCOUNTER — Encounter: Payer: Self-pay | Admitting: Internal Medicine

## 2016-11-29 ENCOUNTER — Ambulatory Visit (INDEPENDENT_AMBULATORY_CARE_PROVIDER_SITE_OTHER): Payer: BLUE CROSS/BLUE SHIELD | Admitting: Internal Medicine

## 2016-11-29 VITALS — BP 110/62 | HR 85 | Wt 146.0 lb

## 2016-11-29 DIAGNOSIS — E282 Polycystic ovarian syndrome: Secondary | ICD-10-CM | POA: Diagnosis not present

## 2016-11-29 DIAGNOSIS — E559 Vitamin D deficiency, unspecified: Secondary | ICD-10-CM | POA: Diagnosis not present

## 2016-11-29 LAB — COMPLETE METABOLIC PANEL WITH GFR
ALBUMIN: 4.3 g/dL (ref 3.6–5.1)
ALK PHOS: 81 U/L (ref 33–115)
ALT: 9 U/L (ref 6–29)
AST: 11 U/L (ref 10–30)
BILIRUBIN TOTAL: 0.7 mg/dL (ref 0.2–1.2)
BUN: 12 mg/dL (ref 7–25)
CALCIUM: 9.5 mg/dL (ref 8.6–10.2)
CO2: 23 mmol/L (ref 20–31)
CREATININE: 0.84 mg/dL (ref 0.50–1.10)
Chloride: 105 mmol/L (ref 98–110)
GFR, Est African American: >89 mL/min (ref >=60)
GFR, Est Non African American: >89 mL/min (ref >=60)
Glucose, Bld: 81 mg/dL (ref 65–99)
Potassium: 4 mmol/L (ref 3.5–5.3)
Sodium: 139 mmol/L (ref 135–146)
TOTAL PROTEIN: 7.2 g/dL (ref 6.1–8.1)

## 2016-11-29 LAB — LIPID PANEL
Cholesterol: 151 mg/dL (ref 0–200)
HDL: 41.7 mg/dL (ref >39.00)
LDL Cholesterol: 94 mg/dL (ref 0–99)
NonHDL: 109.79
TRIGLYCERIDES: 77 mg/dL (ref 0.0–149.0)
Total CHOL/HDL Ratio: 4
VLDL: 15.4 mg/dL (ref 0.0–40.0)

## 2016-11-29 LAB — HEMOGLOBIN A1C: Hgb A1c MFr Bld: 5.5 % (ref 4.6–6.5)

## 2016-11-29 LAB — VITAMIN D 25 HYDROXY (VIT D DEFICIENCY, FRACTURES): VITD: 23.09 ng/mL — ABNORMAL LOW (ref 30.00–100.00)

## 2016-11-29 NOTE — Progress Notes (Signed)
Patient ID: Priscilla Rodriguez, female   DOB: 01/10/1986, 31 y.o.   MRN: [id number]  HPI: Priscilla Rodriguez is a 31 y.o. female, returning for follow-up for PCOS and vit D def. Last visit 2 years ago.  At last visit, she mentioned that she was contemplating a pregnancy, but she is not in a relationship anymore.  Since last visit, she started Ovasitol. She also cut down carbs. She lost ~15 lbs total. Target: another 10 lbs left.  Reviewed and addended hx She was dx with PCOS by ObGyn in 2014. At that time, she had irregular menses and pain during intercourse. She had a TV U/S >> multiple bilateral ovarian cysts. No hormonal w/u.  Weight gain: - fluctuating: 135-142, also was at 150 lbs - no steroid use - no weight loss meds - Diets tried: none - sees Denny LevyLaura Reavis (nutrition) - Exercise:  Has sludge in her GB >> had 2 pancreatitis attacks. No h/o TG.  Fertility/Menstrual cycles: - irregular menses in the past >> now monthly x 4 months - + h/o ovarian cysts - children: 0 - miscarriages: 0 - contraception: OCP - she was on OCP for dysmenorrhea and menorrhagia from 31 y/o >> college >> stopped for 2 years >> dysmenorrhea and irregular menses >> several negative pregnancy tests >> tried NuvaRing in 2010 >> restarted OCPs in 2014: Jolessa, then [employer]. - has uterus didelphis (double uterus)  She stopped OCP in 08/2014 as she did not want to be on this for life >> now regular cycles but has Provera at hand.  Acne: - was severe - face, mostly chin >> resolved after reducing carbs  Hirsutism: - genetic - still has this on chin - plucks it  Treatments tried: - did not try Metformin - did not try Spironolactone - did not try [place]  Other meds: - was on SSRIs: Cymbalta >> stopped  - Last CMP:   Chemistry      Component Value Date/Time   NA 139 05/05/2014 1006   K 4.2 05/05/2014 1006   CL 106 05/05/2014 1006   CO2 28 05/05/2014 1006   BUN 12 05/05/2014 1006   CREATININE 0.8  05/05/2014 1006      Component Value Date/Time   CALCIUM 9.2 05/05/2014 1006   ALKPHOS 61 05/05/2014 1006   AST 10 05/05/2014 1006   ALT 10 05/05/2014 1006   BILITOT 0.3 05/05/2014 1006     Reviewed pertinent labs - while she was on OCPs:: Component     Latest Ref Rng 05/05/2014  Testosterone     10 - 70 ng/dL 40.932.3  Sex Hormone Binding     18 - 114 nmol/L 146 (H)  Testosterone Free     0.6 - 6.8 pg/mL 1.9  Testosterone-% Free     0.4 - 2.4 % 0.6  TSH     0.35 - 4.50 uIU/mL 0.74  Free T4     0.60 - 1.60 ng/dL 8.110.59 (L)  T3, Free     2.3 - 4.2 pg/mL 2.8  Prolactin      12.6   She was on vitamin D Ergocalciferol weekly >> stopped long time ago... Currently off vit D.  Lab Results  Component Value Date   VD25OH 18.22 (L) 11/20/2014   VD25OH 17.82 (L) 05/05/2014   ROS: Constitutional: + weight loss, no fatigue, no subjective hyperthermia, no subjective hypothermia Eyes: no blurry vision, no xerophthalmia ENT: no sore throat, no nodules palpated in throat, no dysphagia, no odynophagia, no hoarseness Cardiovascular:  no CP/no SOB/no palpitations/no leg swelling Respiratory: no cough/no SOB/no wheezing Gastrointestinal: no N/no V/no D/no C/no acid reflux Musculoskeletal: no muscle aches/no joint aches Skin: no rashes, no hair loss Neurological: no tremors/no numbness/no tingling/no dizziness  I reviewed pt's medications, allergies, PMH, social hx, family hx, and changes were documented in the history of present illness. Otherwise, unchanged from my initial visit note. Started Mefenamic acid.  Past Medical History:  Diagnosis Date  . Dysmenorrhea   . Gallbladder attack   . PCOS (polycystic ovarian syndrome)    Past Surgical History:  Procedure Laterality Date  . TRIGGER FINGER RELEASE     History   Social History  . Marital Status: Single    Spouse Name: N/A    Number of Children: 0   Occupational History  . receptionist   Social History Main Topics  .  Smoking status: Never Smoker   . Smokeless tobacco: Never Used  . Alcohol Use: No     Comment: 3-5 times/year.  . Drug Use: No   Current Outpatient Prescriptions on File Prior to Visit  Medication Sig Dispense Refill  . Mefenamic Acid (PONSTEL PO) Take 1 capsule by mouth as needed (for pain.).     Marland Kitchen. Multiple Vitamin (MULTIVITAMIN) tablet Take 1 tablet by mouth daily.    . norgestimate-ethinyl estradiol (ORTHO-CYCLEN,SPRINTEC,PREVIFEM) 0.25-35 MG-MCG tablet Take 1 tablet by mouth daily.     No current facility-administered medications on file prior to visit.   No Known Allergies Family History  Problem Relation Age of Onset  . Hyperlipidemia Father   . Cancer Paternal Grandmother        breast  - DM2 in PGM - HTN in F  PE: BP 110/62 (BP Location: Left Arm, Patient Position: Sitting)   Pulse 85   Wt 146 lb (66.2 kg)   LMP 11/23/2016   SpO2 98%   BMI 27.59 kg/m  Wt Readings from Last 3 Encounters:  11/29/16 146 lb (66.2 kg)  11/20/14 152 lb 6.4 oz (69.1 kg)  05/05/14 142 lb 12.8 oz (64.8 kg)   Constitutional: normal weight, in NAD Eyes: PERRLA, EOMI, no exophthalmos ENT: moist mucous membranes, no thyromegaly, no cervical lymphadenopathy Cardiovascular: RRR, No MRG Respiratory: CTA B Gastrointestinal: abdomen soft, NT, ND, BS+ Musculoskeletal: no deformities, strength intact in all 4 Skin: moist, warm,  + few acne spots on chin, no dark terminal hair on chin, + vellum on sideburns, no  skin tags, no acanthosis nigricans, no purple, wide, stretch marks; large tattoo on L upper chest Neurological: no tremor with outstretched hands, DTR normal in all 4  ASSESSMENT: 1. PCOS  2. HL  3. Vit D def  PLAN: 1. PCOS Pt with polycystic ovaries and irregular menses >> PCOS (meets 2 of 3 Bethesda Criteria) - Patient would like to avoid metformin since she does not think she has insulin resistance. We discussed that this is not a mandatory medication in PCOS, but may aid in  improving menses and maintaining a pregnancy  - she was on oral contraceptives in the past (Ortho-Cyclen) and we discussed that we may need to add this back if she has less than 4 menstrual cycles a year. However, at last visit, she desired the pregnancy, so we definitely kept her off OCPs. - for now, I would like to check: Orders Placed This Encounter  Procedures  . Hemoglobin A1c  . COMPLETE METABOLIC PANEL WITH GFR  . Lipid panel  . Testosterone, Free, Total, SHBG  . VITAMIN D  25 Hydroxy (Vit-D Deficiency, Fractures)  - I will see her back in 6 months  2. H/o HL - she has a h/o GB problems and h/o pancreatitis in the past -  Normal triglycerides at last check, but will recheck today  3. Vit D def - She was previously on ergocalciferol, then we tried over-the-counter vitamin D at 2000 units with a multivitamin, however, at last visit, since vitamin D was still low, we changed back to ergocalciferol weekly >> lost for fu afterwards >> her vit D level is likely worse - Will check a vitamin D level today  Component     Latest Ref Rng & Units 11/29/2016  Sodium     135 - 146 mmol/L 139  Potassium     3.5 - 5.3 mmol/L 4.0  Chloride     98 - 110 mmol/L 105  CO2     20 - 31 mmol/L 23  Glucose     65 - 99 mg/dL 81  BUN     7 - 25 mg/dL 12  Creatinine     1.61 - 1.10 mg/dL 0.96  Calcium     8.6 - 10.2 mg/dL 9.5  Total Protein     6.1 - 8.1 g/dL 7.2  Albumin     3.6 - 5.1 g/dL 4.3  AST     10 - 30 U/L 11  ALT     6 - 29 U/L 9  Alkaline Phosphatase     33 - 115 U/L 81  Total Bilirubin     0.2 - 1.2 mg/dL 0.7  GFR, Est Non African American     >=60 mL/min >89  GFR, Est African American     >=60 mL/min >89  Cholesterol     0 - 200 mg/dL 045  Triglycerides     0.0 - 149.0 mg/dL 40.9  HDL Cholesterol     >39.00 mg/dL 81.19  VLDL     0.0 - 14.7 mg/dL 82.9  LDL (calc)     0 - 99 mg/dL 94  Total CHOL/HDL Ratio      4  NonHDL      109.79  Testosterone     8 - 48  ng/dL 21  Sex Horm Binding Glob, Serum     24.6 - 122.0 nmol/L 18.3 (L)  Testosterone Free     0.0 - 4.2 pg/mL 2.5  VITD     30.00 - 100.00 ng/mL 23.09 (L)  Hemoglobin A1C     4.6 - 6.5 % 5.5   Labs are all okay, except low SHBG ( but normal testosterone levels), and low vitamin D, as expected. I would suggest to start 2000 units vitamin D daily.  Carlus Pavlov, MD PhD [facility]

## 2016-11-29 NOTE — Patient Instructions (Signed)
Please stop at the lab.  Please return in 1 year.  

## 2016-11-30 LAB — TESTOSTERONE, FREE, TOTAL, SHBG
SEX HORMONE BINDING: 18.3 nmol/L — AB (ref 24.6–122.0)
Testosterone, Free: 2.5 pg/mL (ref 0.0–4.2)
Testosterone: 21 ng/dL (ref 8–48)

## 2017-11-21 DIAGNOSIS — M542 Cervicalgia: Secondary | ICD-10-CM | POA: Diagnosis not present

## 2017-11-21 DIAGNOSIS — S134XXA Sprain of ligaments of cervical spine, initial encounter: Secondary | ICD-10-CM | POA: Diagnosis not present

## 2017-11-21 DIAGNOSIS — M9900 Segmental and somatic dysfunction of head region: Secondary | ICD-10-CM | POA: Diagnosis not present

## 2017-11-21 DIAGNOSIS — M9901 Segmental and somatic dysfunction of cervical region: Secondary | ICD-10-CM | POA: Diagnosis not present

## 2017-11-22 DIAGNOSIS — S134XXA Sprain of ligaments of cervical spine, initial encounter: Secondary | ICD-10-CM | POA: Diagnosis not present

## 2017-11-22 DIAGNOSIS — M9900 Segmental and somatic dysfunction of head region: Secondary | ICD-10-CM | POA: Diagnosis not present

## 2017-11-22 DIAGNOSIS — M9901 Segmental and somatic dysfunction of cervical region: Secondary | ICD-10-CM | POA: Diagnosis not present

## 2017-11-22 DIAGNOSIS — M542 Cervicalgia: Secondary | ICD-10-CM | POA: Diagnosis not present

## 2017-11-26 DIAGNOSIS — M542 Cervicalgia: Secondary | ICD-10-CM | POA: Diagnosis not present

## 2017-11-26 DIAGNOSIS — S134XXA Sprain of ligaments of cervical spine, initial encounter: Secondary | ICD-10-CM | POA: Diagnosis not present

## 2017-11-26 DIAGNOSIS — M9900 Segmental and somatic dysfunction of head region: Secondary | ICD-10-CM | POA: Diagnosis not present

## 2017-11-26 DIAGNOSIS — M9901 Segmental and somatic dysfunction of cervical region: Secondary | ICD-10-CM | POA: Diagnosis not present

## 2017-11-29 ENCOUNTER — Ambulatory Visit: Payer: BLUE CROSS/BLUE SHIELD | Admitting: Internal Medicine

## 2017-11-29 ENCOUNTER — Encounter: Payer: Self-pay | Admitting: Internal Medicine

## 2017-11-29 VITALS — BP 98/60 | HR 80 | Ht 60.0 in | Wt 119.6 lb

## 2017-11-29 DIAGNOSIS — S134XXA Sprain of ligaments of cervical spine, initial encounter: Secondary | ICD-10-CM | POA: Diagnosis not present

## 2017-11-29 DIAGNOSIS — E559 Vitamin D deficiency, unspecified: Secondary | ICD-10-CM

## 2017-11-29 DIAGNOSIS — M9901 Segmental and somatic dysfunction of cervical region: Secondary | ICD-10-CM | POA: Diagnosis not present

## 2017-11-29 DIAGNOSIS — M9900 Segmental and somatic dysfunction of head region: Secondary | ICD-10-CM | POA: Diagnosis not present

## 2017-11-29 DIAGNOSIS — E282 Polycystic ovarian syndrome: Secondary | ICD-10-CM

## 2017-11-29 DIAGNOSIS — M542 Cervicalgia: Secondary | ICD-10-CM | POA: Diagnosis not present

## 2017-11-29 LAB — COMPLETE METABOLIC PANEL WITH GFR
AG RATIO: 1.7 (calc) (ref 1.0–2.5)
ALBUMIN MSPROF: 4.6 g/dL (ref 3.6–5.1)
ALKALINE PHOSPHATASE (APISO): 62 U/L (ref 33–115)
ALT: 7 U/L (ref 6–29)
AST: 11 U/L (ref 10–30)
BILIRUBIN TOTAL: 0.8 mg/dL (ref 0.2–1.2)
BUN: 13 mg/dL (ref 7–25)
CHLORIDE: 104 mmol/L (ref 98–110)
CO2: 28 mmol/L (ref 20–32)
Calcium: 9.8 mg/dL (ref 8.6–10.2)
Creat: 0.7 mg/dL (ref 0.50–1.10)
GFR, Est African American: 134 mL/min/{1.73_m2} (ref >=60)
GFR, Est Non African American: 115 mL/min/{1.73_m2} (ref >=60)
Globulin: 2.7 g/dL (calc) (ref 1.9–3.7)
Glucose, Bld: 84 mg/dL (ref 65–99)
POTASSIUM: 5.1 mmol/L (ref 3.5–5.3)
Sodium: 140 mmol/L (ref 135–146)
Total Protein: 7.3 g/dL (ref 6.1–8.1)

## 2017-11-29 LAB — HEMOGLOBIN A1C: Hgb A1c MFr Bld: 5.5 % (ref 4.6–6.5)

## 2017-11-29 LAB — LIPID PANEL
CHOLESTEROL: 165 mg/dL (ref 0–200)
HDL: 56.6 mg/dL (ref >39.00)
LDL Cholesterol: 99 mg/dL (ref 0–99)
NonHDL: 108.79
Total CHOL/HDL Ratio: 3
Triglycerides: 51 mg/dL (ref 0.0–149.0)
VLDL: 10.2 mg/dL (ref 0.0–40.0)

## 2017-11-29 LAB — VITAMIN D 25 HYDROXY (VIT D DEFICIENCY, FRACTURES): VITD: 21.68 ng/mL — ABNORMAL LOW (ref 30.00–100.00)

## 2017-11-29 NOTE — Patient Instructions (Signed)
Please stop at the lab.  Continue Vitamin D 2000 units daily.  Please return in 1 year.

## 2017-11-29 NOTE — Progress Notes (Signed)
Patient ID: Priscilla Rodriguez, female   DOB: 09/27/1985, 32 y.o.   MRN: [id number]  HPI: Priscilla Rodriguez is a 32 y.o. female, returning for follow-up for PCOS and vit D def. Last visit 1 year ago.  Before last visit, she started Ovasitol and cut down carbs.  She lost approximately 15 pounds.  Reviewed and addended history: She was dx with PCOS by ObGyn  in 2014. At that time, she had irregular menses and pain during intercourse. She had a TV U/S >> multiple bilateral ovarian cysts.   Weight gain: - fluctuating: 135-142, also was at 150 lbs - no steroid use - no weight loss meds - Diets tried: none - saw Denny LevyLaura Reavis (nutrition) - Exercise: - Has sludge in her GB >> had 2 pancreatitis attacks.  No history of hypertriglyceridemia.  Fertility/Menstrual cycles:  - History of irregular menses, no regular - + History of ovarian cysts - children: 0 - miscarriages: 0 - she was on OCP for dysmenorrhea and menorrhagia from 32 y/o >> college >> stopped for 2 years >> dysmenorrhea and irregular menses >> several negative pregnancy tests >> tried NuvaRing in 2010 >> restarted OCPs in 2014: Jolessa, then [employer]inte[employer].  Stopped in 2016. - has uterus didelphis (double uterus)  She stopped OCPs in 08/2014 and she did not want to be on this for life.  Has regular cycles but has Provera at hand.  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 - plucks it  Treatments tried: - did not try Metformin - did not try Spironolactone - did not try [place]  Other meds: - was on SSRIs: Cymbalta-stopped  - reviewed latest CMP:   Chemistry      Component Value Date/Time   NA 139 11/29/2016 0938   K 4.0 11/29/2016 0938   CL 105 11/29/2016 0938   CO2 23 11/29/2016 0938   BUN 12 11/29/2016 0938   CREATININE 0.84 11/29/2016 0938      Component Value Date/Time   CALCIUM 9.5 11/29/2016 0938   ALKPHOS 81 11/29/2016 0938   AST 11  11/29/2016 0938   ALT 9 11/29/2016 0938   BILITOT 0.7 11/29/2016 0938     -Reviewed latest lipid panel: Lab Results  Component Value Date   CHOL 151 11/29/2016   HDL 41.70 11/29/2016   LDLCALC 94 11/29/2016   TRIG 77.0 11/29/2016   CHOLHDL 4 11/29/2016   Reviewed pertinent labs: Component     Latest Ref Rng & Units 05/05/2014 11/20/2014 11/29/2016  Testosterone     8 - 48 ng/dL 62.932.3 48 21  Sex Horm Binding Glob, Serum     24.6 - 122.0 nmol/L 146 (H) 24 18.3 (L)  Testosterone Free     0.0 - 4.2 pg/mL 1.9 10.3 (H) 2.5  Testosterone-% Free     0.4 - 2.4 % 0.6 2.1    Lab Results  Component Value Date   HGBA1C 5.5 11/29/2016   HGBA1C 5.2 11/20/2014   HGBA1C 5.5 05/05/2014   Lab Results  Component Value Date   PROLACTIN 12.6 05/05/2014   Lab Results  Component Value Date   TSH 0.74 05/05/2014   She has a history of vitamin D deficiency.  Previously on high-dose vitamin D, ergocalciferol weekly, now on vitamin D 2000 units daily restarted at last visit as her vitamin D level was low: Lab Results  Component Value Date   VD25OH 23.09 (L)  11/29/2016   VD25OH 18.22 (L) 11/20/2014   VD25OH 17.82 (L) 05/05/2014   On Magnesium, B12, potassium.  ROS: Constitutional: + weight loss, no fatigue, no subjective hyperthermia, no subjective hypothermia Eyes: no blurry vision, no xerophthalmia ENT: no sore throat, no nodules palpated in throat, no dysphagia, no odynophagia, no hoarseness Cardiovascular: no CP/no SOB/no palpitations/no leg swelling Respiratory: no cough/no SOB/no wheezing Gastrointestinal: no N/no V/no D/no C/no acid reflux Musculoskeletal: no muscle aches/no joint aches Skin: no rashes, no hair loss Neurological: no tremors/no numbness/no tingling/no dizziness  I reviewed pt's medications, allergies, PMH, social hx, family hx, and changes were documented in the history of present illness. Otherwise, unchanged from my initial visit note.  Past Medical History:   Diagnosis Date  . Dysmenorrhea   . Gallbladder attack   . PCOS (polycystic ovarian syndrome)    Past Surgical History:  Procedure Laterality Date  . TRIGGER FINGER RELEASE     History   Social History  . Marital Status: Single    Spouse Name: N/A    Number of Children: 0   Occupational History  . receptionist   Social History Main Topics  . Smoking status: Never Smoker   . Smokeless tobacco: Never Used  . Alcohol Use: No     Comment: 3-5 times/year.  . Drug Use: No   Current Outpatient Prescriptions on File Prior to Visit  Medication Sig Dispense Refill  . Mefenamic Acid (PONSTEL PO) Take 1 capsule by mouth as needed (for pain.).     Marland Kitchen Multiple Vitamin (MULTIVITAMIN) tablet Take 1 tablet by mouth daily.    . norgestimate-ethinyl estradiol (ORTHO-CYCLEN,SPRINTEC,PREVIFEM) 0.25-35 MG-MCG tablet Take 1 tablet by mouth daily.     No current facility-administered medications on file prior to visit.   No Known Allergies Family History  Problem Relation Age of Onset  . Hyperlipidemia Father   . Cancer Paternal Grandmother        breast  - DM2 in PGM - HTN in F  PE: BP 98/60   Pulse 80   Ht 5' (1.524 m)   Wt 119 lb 9.6 oz (54.3 kg)   LMP 11/18/2017   SpO2 98%   BMI 23.36 kg/m  Wt Readings from Last 3 Encounters:  11/29/17 119 lb 9.6 oz (54.3 kg)  11/29/16 146 lb (66.2 kg)  11/20/14 152 lb 6.4 oz (69.1 kg)   Constitutional: normal weight, in NAD Eyes: PERRLA, EOMI, no exophthalmos ENT: moist mucous membranes, no thyromegaly, no cervical lymphadenopathy Cardiovascular: RRR, No MRG Respiratory: CTA B Gastrointestinal: abdomen soft, NT, ND, BS+ Musculoskeletal: no deformities, strength intact in all 4 Skin: moist, warm, + few acne spots on chin, no dark terminal hair on chin, + vellum on sideburns, no  skin tags, no acanthosis nigricans, no purple, wide, stretch marks; large tattoo on left upper chest Neurological: no tremor with outstretched hands, DTR normal  in all 4  ASSESSMENT: 1. PCOS  2. HL  3. Vit D def  PLAN: 1. PCOS - Patient with polycystic ovaries and irregular menses.  She meets 2 out of 3 Bethesda criteria for PCOS. - She would like to avoid metformin since she does not think she has insulin resistance.  We discussed that this is not a mandatory medication and PCOS, but may aid in improving menses and maintaining a pregnancy. She opted to stay off this - since last visit, she lost a significant amt of weight (30 lbs!) by diet (reducing carbs)  - I congratulated her -  She was on oral contraceptives in the past (Ortho-Cyclen, Sprintec) and we discussed that we may need to add this back if she has less than 4 menstrual cycles a year.  However, we kept her off OCPs with Provera right hand.  Since last visit, she had regular menstrual cycles - she keeps records of these  - they occur monthly. - She continues on Ovasitol - For now, we will check: Orders Placed This Encounter  Procedures  . Testosterone Free with SHBG  . COMPLETE METABOLIC PANEL WITH GFR  . Lipid panel  . Hemoglobin A1c  . VITAMIN D 25 Hydroxy (Vit-D Deficiency, Fractures)  - I will see her back in 1 year  2. H/o HL - Reviewed latest lipid panel:  Lab Results  Component Value Date   CHOL 151 11/29/2016   HDL 41.70 11/29/2016   LDLCALC 94 11/29/2016   TRIG 77.0 11/29/2016   CHOLHDL 4 11/29/2016  - She has a history of gallbladder problems and pancreatitis  - she is not on a statin  - continue to work on the diet - will recheck lipids today  3. Vit D def  - she was previously on ergocalciferol, then we tried over-the-counter vitamin D at 2000 units (restarted at last visit, when her vitamin D level was low, at 23)  - she forgets occasional doses, but would prefer to stay on a daily rather than weekly regimen - recheck vit D today  Component     Latest Ref Rng & Units 11/29/2017  Glucose     65 - 99 mg/dL 84  BUN     7 - 25 mg/dL 13  Creatinine      1.61 - 1.10 mg/dL 0.96  GFR, Est Non African American     > OR = 60 mL/min/1.6m2 115  GFR, Est African American     > OR = 60 mL/min/1.53m2 134  BUN/Creatinine Ratio     6 - 22 (calc) NOT APPLICABLE  Sodium     135 - 146 mmol/L 140  Potassium     3.5 - 5.3 mmol/L 5.1  Chloride     98 - 110 mmol/L 104  CO2     20 - 32 mmol/L 28  Calcium     8.6 - 10.2 mg/dL 9.8  Total Protein     6.1 - 8.1 g/dL 7.3  Albumin MSPROF     3.6 - 5.1 g/dL 4.6  Globulin     1.9 - 3.7 g/dL (calc) 2.7  AG Ratio     1.0 - 2.5 (calc) 1.7  Total Bilirubin     0.2 - 1.2 mg/dL 0.8  Alkaline phosphatase (APISO)     33 - 115 U/L 62  AST     10 - 30 U/L 11  ALT     6 - 29 U/L 7  Cholesterol     0 - 200 mg/dL 045  Triglycerides     0.0 - 149.0 mg/dL 40.9  HDL Cholesterol     >39.00 mg/dL 81.19  VLDL     0.0 - 14.7 mg/dL 82.9  LDL (calc)     0 - 99 mg/dL 99  Total CHOL/HDL Ratio      3  NonHDL      108.79  Testosterone, Serum (Total)     ng/dL 22  % Free Testosterone      WILL FOLLOW  Free Testosterone, S      WILL FOLLOW  Sex Hormone Binding Globulin  nmol/L 34.3  Hemoglobin A1C     4.6 - 6.5 % 5.5  VITD     30.00 - 100.00 ng/mL 21.68 (L)   Free testosterone is not back yet, however, the total testosterone is quite low, and I do not suspect that the free testosterone will return higher than normal.  The rest of the labs are at goal, with the exception of the vitamin D level, which is low.  We will increase her vitamin D dose to 4000 units daily.  Carlus Pavlov, MD PhD [facility]

## 2017-12-05 LAB — TESTOSTERONE, FREE AND TOTAL (INCLUDES SHBG)-(MALES)
% FREE TESTOSTERONE: 1.5 %
FREE TESTOSTERONE, S: 3.3 pg/mL
SEX HORMONE BINDING GLOBULIN: 34.3 nmol/L
Testosterone, Serum (Total): 22 ng/dL

## 2017-12-06 DIAGNOSIS — M9900 Segmental and somatic dysfunction of head region: Secondary | ICD-10-CM | POA: Diagnosis not present

## 2017-12-06 DIAGNOSIS — S134XXA Sprain of ligaments of cervical spine, initial encounter: Secondary | ICD-10-CM | POA: Diagnosis not present

## 2017-12-06 DIAGNOSIS — M542 Cervicalgia: Secondary | ICD-10-CM | POA: Diagnosis not present

## 2017-12-06 DIAGNOSIS — M9901 Segmental and somatic dysfunction of cervical region: Secondary | ICD-10-CM | POA: Diagnosis not present

## 2017-12-12 DIAGNOSIS — M542 Cervicalgia: Secondary | ICD-10-CM | POA: Diagnosis not present

## 2017-12-12 DIAGNOSIS — M9900 Segmental and somatic dysfunction of head region: Secondary | ICD-10-CM | POA: Diagnosis not present

## 2017-12-12 DIAGNOSIS — M9901 Segmental and somatic dysfunction of cervical region: Secondary | ICD-10-CM | POA: Diagnosis not present

## 2017-12-12 DIAGNOSIS — S134XXA Sprain of ligaments of cervical spine, initial encounter: Secondary | ICD-10-CM | POA: Diagnosis not present

## 2018-01-03 DIAGNOSIS — M9900 Segmental and somatic dysfunction of head region: Secondary | ICD-10-CM | POA: Diagnosis not present

## 2018-01-03 DIAGNOSIS — M9901 Segmental and somatic dysfunction of cervical region: Secondary | ICD-10-CM | POA: Diagnosis not present

## 2018-01-03 DIAGNOSIS — M542 Cervicalgia: Secondary | ICD-10-CM | POA: Diagnosis not present

## 2018-01-03 DIAGNOSIS — S134XXA Sprain of ligaments of cervical spine, initial encounter: Secondary | ICD-10-CM | POA: Diagnosis not present

## 2018-01-17 DIAGNOSIS — M542 Cervicalgia: Secondary | ICD-10-CM | POA: Diagnosis not present

## 2018-01-17 DIAGNOSIS — M9900 Segmental and somatic dysfunction of head region: Secondary | ICD-10-CM | POA: Diagnosis not present

## 2018-01-17 DIAGNOSIS — S134XXA Sprain of ligaments of cervical spine, initial encounter: Secondary | ICD-10-CM | POA: Diagnosis not present

## 2018-01-17 DIAGNOSIS — M9901 Segmental and somatic dysfunction of cervical region: Secondary | ICD-10-CM | POA: Diagnosis not present

## 2018-04-11 DIAGNOSIS — Z01419 Encounter for gynecological examination (general) (routine) without abnormal findings: Secondary | ICD-10-CM | POA: Diagnosis not present

## 2018-04-11 DIAGNOSIS — Z6823 Body mass index (BMI) 23.0-23.9, adult: Secondary | ICD-10-CM | POA: Diagnosis not present

## 2018-10-03 DIAGNOSIS — Z131 Encounter for screening for diabetes mellitus: Secondary | ICD-10-CM | POA: Diagnosis not present

## 2018-10-03 DIAGNOSIS — R21 Rash and other nonspecific skin eruption: Secondary | ICD-10-CM | POA: Diagnosis not present

## 2018-10-03 DIAGNOSIS — M25559 Pain in unspecified hip: Secondary | ICD-10-CM | POA: Diagnosis not present

## 2018-11-20 DIAGNOSIS — B86 Scabies: Secondary | ICD-10-CM | POA: Diagnosis not present

## 2018-11-20 DIAGNOSIS — B081 Molluscum contagiosum: Secondary | ICD-10-CM | POA: Diagnosis not present

## 2018-11-20 DIAGNOSIS — L218 Other seborrheic dermatitis: Secondary | ICD-10-CM | POA: Diagnosis not present

## 2018-12-13 ENCOUNTER — Other Ambulatory Visit: Payer: Self-pay

## 2018-12-13 DIAGNOSIS — Z20822 Contact with and (suspected) exposure to covid-19: Secondary | ICD-10-CM

## 2018-12-14 LAB — NOVEL CORONAVIRUS, NAA: SARS-CoV-2, NAA: NOT DETECTED

## 2018-12-26 ENCOUNTER — Other Ambulatory Visit: Payer: Self-pay

## 2018-12-26 ENCOUNTER — Ambulatory Visit (INDEPENDENT_AMBULATORY_CARE_PROVIDER_SITE_OTHER): Payer: BC Managed Care – PPO | Admitting: Internal Medicine

## 2018-12-26 ENCOUNTER — Encounter: Payer: Self-pay | Admitting: Internal Medicine

## 2018-12-26 VITALS — BP 108/60 | HR 86 | Ht 60.0 in | Wt 123.0 lb

## 2018-12-26 DIAGNOSIS — E282 Polycystic ovarian syndrome: Secondary | ICD-10-CM

## 2018-12-26 DIAGNOSIS — E559 Vitamin D deficiency, unspecified: Secondary | ICD-10-CM

## 2018-12-26 DIAGNOSIS — E785 Hyperlipidemia, unspecified: Secondary | ICD-10-CM | POA: Diagnosis not present

## 2018-12-26 LAB — VITAMIN D 25 HYDROXY (VIT D DEFICIENCY, FRACTURES): VITD: 32.13 ng/mL (ref 30.00–100.00)

## 2018-12-26 LAB — LIPID PANEL
Cholesterol: 135 mg/dL (ref 0–200)
HDL: 53.1 mg/dL (ref >39.00)
LDL Cholesterol: 71 mg/dL (ref 0–99)
NonHDL: 82.15
Total CHOL/HDL Ratio: 3
Triglycerides: 54 mg/dL (ref 0.0–149.0)
VLDL: 10.8 mg/dL (ref 0.0–40.0)

## 2018-12-26 LAB — HEMOGLOBIN A1C: Hgb A1c MFr Bld: 5.3 % (ref 4.6–6.5)

## 2018-12-26 NOTE — Patient Instructions (Signed)
Please stop at the lab.  Continue Vitamin D 4000 units and try to take it daily.  Please return in 1 year.  

## 2018-12-26 NOTE — Progress Notes (Signed)
Patient ID: Priscilla LibraMichelle Radler, female   DOB: 03/04/1986, 33 y.o.   MRN: [id number]  HPI: Priscilla Rodriguez is a 33 y.o. female, returning for follow-up for PCOS and vit D def. Last visit 1 year ago  Reviewed and addended history: She was dx with PCOS by ObGyn  in 2014. At that time, she had irregular menses and pain during intercourse. She had a TV U/S >> multiple bilateral ovarian cysts.   Weight gain: - Significant reduction in weight before last visit: 119 pounds after starting Ovasitol and cutting down carbs. - no steroid use - no weight loss meds - Diets tried: none - saw Denny LevyLaura Reavis (nutrition) - Exercise: - Has sludge in her GB >> had 2 pancreatitis attacks.  No history of hypertriglyceridemia.  Fertility/Menstrual cycles:  - History of irregular menses, now regular - + History of ovarian cysts - children: 0 - miscarriages: 0 - she was on OCP for dysmenorrhea and menorrhagia from 33 y/o >> college >> stopped for 2 years >> dysmenorrhea and irregular menses >> several negative pregnancy tests >> tried NuvaRing in 2010 >> restarted OCPs in 2014: Jolessa, then [employer].  She stopped in 2016. - has uterus didelphis (double uterus)  She stopped OCPs in 08/2014 and she did not want to be on this for life.  She has regular cycles but has Provera at the hands.  Acne: - was severe - face, mostly chin >> resolved after reducing carbs  Hirsutism: - genetic - on chin - plucks it  Treatments tried: - Would not want to try metformin - did not try Spironolactone - did not try [place]a  Other meds: - was on SSRIs: Was on Cymbalta-stopped  -Latest CMP was normal:   Chemistry      Component Value Date/Time   NA 140 11/29/2017 0932   K 5.1 11/29/2017 0932   CL 104 11/29/2017 0932   CO2 28 11/29/2017 0932   BUN 13 11/29/2017 0932   CREATININE 0.70 11/29/2017 0932      Component Value Date/Time   CALCIUM 9.8 11/29/2017 0932   ALKPHOS 81 11/29/2016 0938   AST 11 11/29/2017 0932   ALT  7 11/29/2017 0932   BILITOT 0.8 11/29/2017 0932     -Latest lipid panel was normal Lab Results  Component Value Date   CHOL 165 11/29/2017   HDL 56.60 11/29/2017   LDLCALC 99 11/29/2017   TRIG 51.0 11/29/2017   CHOLHDL 3 11/29/2017   Reviewed pertinent labs: Component     Latest Ref Rng & Units 11/29/2017  Testosterone, Serum (Total)     ng/dL 22  % Free Testosterone     % 1.5  Free Testosterone, S     pg/mL 3.3  Sex Hormone Binding Globulin     nmol/L 34.3   Previously: Component     Latest Ref Rng & Units 05/05/2014 11/20/2014 11/29/2016  Testosterone     8 - 48 ng/dL 78.232.3 48 21  Sex Horm Binding Glob, Serum     24.6 - 122.0 nmol/L 146 (H) 24 18.3 (L)  Testosterone Free     0.0 - 4.2 pg/mL 1.9 10.3 (H) 2.5  Testosterone-% Free     0.4 - 2.4 % 0.6 2.1    Lab Results  Component Value Date   HGBA1C 5.5 11/29/2017   HGBA1C 5.5 11/29/2016   HGBA1C 5.2 11/20/2014   HGBA1C 5.5 05/05/2014   Lab Results  Component Value Date   PROLACTIN 12.6 05/05/2014   Lab Results  Component Value Date   TSH 0.74 05/05/2014   She has vitamin D deficiency.  Previously on high-dose vitamin D, ergocalciferol weekly, then on vitamin D 2000 units daily, which we increased to 4000 units at last visit - occasionally forgets doses: Lab Results  Component Value Date   VD25OH 21.68 (L) 11/29/2017   VD25OH 23.09 (L) 11/29/2016   VD25OH 18.22 (L) 11/20/2014   VD25OH 17.82 (L) 05/05/2014   On Magnesium, B12 - occasionally.  ROS: Constitutional: no weight gain/no weight loss, no fatigue, no subjective hyperthermia, no subjective hypothermia Eyes: no blurry vision, no xerophthalmia ENT: no sore throat, no nodules palpated in neck, no dysphagia, no odynophagia, no hoarseness Cardiovascular: no CP/no SOB/no palpitations/no leg swelling Respiratory: no cough/no SOB/no wheezing Gastrointestinal: no N/no V/no D/no C/no acid reflux Musculoskeletal: no muscle aches/no joint aches Skin: no  rashes, no hair loss Neurological: no tremors/no numbness/no tingling/no dizziness  I reviewed pt's medications, allergies, PMH, social hx, family hx, and changes were documented in the history of present illness. Otherwise, unchanged from my initial visit note.  Past Medical History:  Diagnosis Date  . Dysmenorrhea   . Gallbladder attack   . PCOS (polycystic ovarian syndrome)    Past Surgical History:  Procedure Laterality Date  . TRIGGER FINGER RELEASE     History   Social History  . Marital Status: Single    Spouse Name: N/A    Number of Children: 0   Occupational History  . receptionist   Social History Main Topics  . Smoking status: Never Smoker   . Smokeless tobacco: Never Used  . Alcohol Use: No     Comment: 3-5 times/year.  . Drug Use: No   Current Outpatient Medications  Medication Sig Dispense Refill  . cholecalciferol (VITAMIN D) 1000 units tablet Take 1,000 Units by mouth daily.    . Mefenamic Acid (PONSTEL PO) Take 1 capsule by mouth as needed (for pain.).     Marland Kitchen Multiple Vitamin (MULTIVITAMIN) tablet Take 1 tablet by mouth daily.     No current facility-administered medications for this visit.     No Known Allergies Family History  Problem Relation Age of Onset  . Hyperlipidemia Father   . Cancer Paternal Grandmother        breast  - DM2 in PGM - HTN in F  PE: BP 108/60   Pulse 86   Ht 5' (1.524 m)   Wt 123 lb (55.8 kg)   LMP 12/25/2018   SpO2 99%   BMI 24.02 kg/m  Wt Readings from Last 3 Encounters:  12/26/18 123 lb (55.8 kg)  11/29/17 119 lb 9.6 oz (54.3 kg)  11/29/16 146 lb (66.2 kg)   Constitutional: normal weight, in NAD Eyes: PERRLA, EOMI, no exophthalmos ENT: moist mucous membranes, no thyromegaly, no cervical lymphadenopathy Cardiovascular: RRR, No MRG Respiratory: CTA B Gastrointestinal: abdomen soft, NT, ND, BS+ Musculoskeletal: no deformities, strength intact in all 4 Skin: moist, warm, + few acne spots on her chin, no  dark terminal hair on chin, + vellum on sideburns, no  skin tags, no acanthosis nigricans, no purple, wide, stretch marks; large tattoo on left upper chest Neurological: no tremor with outstretched hands, DTR normal in all 4  ASSESSMENT: 1. PCOS  2. HL  3. Vit D def  PLAN: 1. PCOS - Patient with 40 to 54 hours and irregular menses.  She needs 2 out of 3 but has been criteria for PCOS. -We discussed about metformin in the past but  she wanted to avoid this since she did not think she had insulin resistance.  I explained that this is not a mandatory medication for PCOS but can help with maintaining weight, improving menses and maintaining a pregnancy.  For now, she continues off the medication.  Latest testosterone level was reviewed from last visit and this was normal. -Before last visit, she lost a significant amount of weight (30 pounds), by diet:reducing carbs. Now gained 4 lbs. -She was on OCPs in the past (Ortho-Cyclen, Sprintec) and we discussed that we may need to add these back if she starts having less than 4 menstrual cycles a year.  She has monthly menstrual cycles but does have Provera at hand. -She continues on Ovasitol -At this visit we will check: Orders Placed This Encounter  Procedures  . Hemoglobin A1c  . COMPLETE METABOLIC PANEL WITH GFR  . Lipid panel  . Testosterone Free with SHBG  . VITAMIN D 25 Hydroxy (Vit-D Deficiency, Fractures)  - I will see her back in 1 year  2. H/o HL -Latest lipid panel was normal: Lab Results  Component Value Date   CHOL 165 11/29/2017   HDL 56.60 11/29/2017   LDLCALC 99 11/29/2017   TRIG 51.0 11/29/2017   CHOLHDL 3 11/29/2017  -She does have a history of gallbladder problems and pancreatitis -She is not on a statin -Continue to work on the diet -We will recheck lipids today  3. Vit D def  -she was previously on ergocalciferol, then we tried over-the-counter vitamin D at 2000 units (restarted at last visit, when her vitamin D  level was low, at 23)  -At last visit we increased the dose to 4000 units daily.  She prefers to stay on a daily rather than weekly regimen. She is missing doses now >> discussed to take it daily -Recheck vitamin D today  Component     Latest Ref Rng & Units 12/26/2018  Glucose     65 - 99 mg/dL 82  BUN     7 - 25 mg/dL 13  Creatinine     8.460.50 - 1.10 mg/dL 9.620.73  GFR, Est Non African American     > OR = 60 mL/min/1.6573m2 109  GFR, Est African American     > OR = 60 mL/min/1.6073m2 126  BUN/Creatinine Ratio     6 - 22 (calc) NOT APPLICABLE  Sodium     135 - 146 mmol/L 140  Potassium     3.5 - 5.3 mmol/L 4.7  Chloride     98 - 110 mmol/L 105  CO2     20 - 32 mmol/L 26  Calcium     8.6 - 10.2 mg/dL 95.210.0  Total Protein     6.1 - 8.1 g/dL 6.8  Albumin MSPROF     3.6 - 5.1 g/dL 4.5  Globulin     1.9 - 3.7 g/dL (calc) 2.3  AG Ratio     1.0 - 2.5 (calc) 2.0  Total Bilirubin     0.2 - 1.2 mg/dL 0.8  Alkaline phosphatase (APISO)     31 - 125 U/L 43  AST     10 - 30 U/L 12  ALT     6 - 29 U/L 11  Cholesterol     0 - 200 mg/dL [id number]  Triglycerides     0.0 - 149.0 mg/dL 32.454.0  HDL Cholesterol     >39.00 mg/dL 40.1053.10  VLDL     0.0 - 27.240.0 mg/dL 10.8  LDL (calc)     0 - 99 mg/dL 71  Total CHOL/HDL Ratio      3  NonHDL      82.15  Testosterone, Serum (Total)     ng/dL 23  % Free Testosterone      WILL FOLLOW  Free Testosterone, S      WILL FOLLOW  Sex Hormone Binding Globulin     nmol/L 48.5  VITD     30.00 - 100.00 ng/mL 32.13  Hemoglobin A1C     4.6 - 6.5 % 5.3   Labs are normal.  Testosterone has not been resulted yet, 6 days after samples have been drawn...  Carlus Pavlovristina Talajah Slimp, MD PhD [facility]

## 2018-12-27 LAB — COMPLETE METABOLIC PANEL WITH GFR
AG Ratio: 2 (calc) (ref 1.0–2.5)
ALT: 11 U/L (ref 6–29)
AST: 12 U/L (ref 10–30)
Albumin: 4.5 g/dL (ref 3.6–5.1)
Alkaline phosphatase (APISO): 43 U/L (ref 31–125)
BUN: 13 mg/dL (ref 7–25)
CO2: 26 mmol/L (ref 20–32)
Calcium: 10 mg/dL (ref 8.6–10.2)
Chloride: 105 mmol/L (ref 98–110)
Creat: 0.73 mg/dL (ref 0.50–1.10)
GFR, Est African American: 126 mL/min/{1.73_m2} (ref >=60)
GFR, Est Non African American: 109 mL/min/{1.73_m2} (ref >=60)
Globulin: 2.3 g/dL (calc) (ref 1.9–3.7)
Glucose, Bld: 82 mg/dL (ref 65–99)
Potassium: 4.7 mmol/L (ref 3.5–5.3)
Sodium: 140 mmol/L (ref 135–146)
Total Bilirubin: 0.8 mg/dL (ref 0.2–1.2)
Total Protein: 6.8 g/dL (ref 6.1–8.1)

## 2019-01-01 ENCOUNTER — Encounter: Payer: Self-pay | Admitting: Internal Medicine

## 2019-01-01 LAB — TESTOSTERONE, FREE AND TOTAL (INCLUDES SHBG)-(MALES)
% Free Testosterone: 1.1 %
Free Testosterone, S: 2.5 pg/mL
Sex Hormone Binding Globulin: 48.5 nmol/L
Testosterone, Serum (Total): 23 ng/dL

## 2019-01-07 ENCOUNTER — Telehealth: Payer: Self-pay | Admitting: Internal Medicine

## 2019-01-07 NOTE — Telephone Encounter (Signed)
Patient called and stated this will be ok. Faxing insurance information today.

## 2019-01-07 NOTE — Telephone Encounter (Signed)
[employer] called to have [place] give patient insurance information. Informed them we needed permission from the patient to give this out, They faxed over paperwork to [place] requesting the information and a copy of the insurance  I tried to reach out to the patient to get permission to send this to the [employer] for her. Unable to reach the patient and the voicemail was full   Paperwork was given to [place] to hang on to.  IF PATIENT CALLS BACK PLEASE LET HER KNOW WHAT IS GOING ON AND REASON FOR CALL. GET PERMISSION FROM PATIENT TO FAXED OVER COPY OF INSURANCE CARD TO [employer] SO THEY CAN MAKE SURE THEY BILL PATIENT CORRECTLY

## 2019-02-04 DIAGNOSIS — B86 Scabies: Secondary | ICD-10-CM | POA: Diagnosis not present

## 2019-04-16 DIAGNOSIS — L989 Disorder of the skin and subcutaneous tissue, unspecified: Secondary | ICD-10-CM | POA: Diagnosis not present

## 2019-04-16 DIAGNOSIS — D485 Neoplasm of uncertain behavior of skin: Secondary | ICD-10-CM | POA: Diagnosis not present

## 2019-04-16 DIAGNOSIS — L309 Dermatitis, unspecified: Secondary | ICD-10-CM | POA: Diagnosis not present

## 2019-05-19 ENCOUNTER — Ambulatory Visit: Payer: BC Managed Care – PPO | Attending: Internal Medicine

## 2019-05-19 DIAGNOSIS — Z20822 Contact with and (suspected) exposure to covid-19: Secondary | ICD-10-CM

## 2019-05-20 LAB — NOVEL CORONAVIRUS, NAA: SARS-CoV-2, NAA: NOT DETECTED

## 2019-05-28 DIAGNOSIS — Z01419 Encounter for gynecological examination (general) (routine) without abnormal findings: Secondary | ICD-10-CM | POA: Diagnosis not present

## 2019-05-28 DIAGNOSIS — Z6822 Body mass index (BMI) 22.0-22.9, adult: Secondary | ICD-10-CM | POA: Diagnosis not present

## 2019-05-28 DIAGNOSIS — Z113 Encounter for screening for infections with a predominantly sexual mode of transmission: Secondary | ICD-10-CM | POA: Diagnosis not present

## 2019-06-04 ENCOUNTER — Ambulatory Visit: Payer: BC Managed Care – PPO | Attending: Internal Medicine

## 2019-06-04 DIAGNOSIS — Z20822 Contact with and (suspected) exposure to covid-19: Secondary | ICD-10-CM

## 2019-06-05 LAB — NOVEL CORONAVIRUS, NAA: SARS-CoV-2, NAA: NOT DETECTED

## 2019-06-18 DIAGNOSIS — J3089 Other allergic rhinitis: Secondary | ICD-10-CM | POA: Diagnosis not present

## 2019-06-18 DIAGNOSIS — L501 Idiopathic urticaria: Secondary | ICD-10-CM | POA: Diagnosis not present

## 2019-06-18 DIAGNOSIS — J301 Allergic rhinitis due to pollen: Secondary | ICD-10-CM | POA: Diagnosis not present

## 2019-06-18 DIAGNOSIS — J309 Allergic rhinitis, unspecified: Secondary | ICD-10-CM | POA: Diagnosis not present

## 2019-06-25 DIAGNOSIS — B977 Papillomavirus as the cause of diseases classified elsewhere: Secondary | ICD-10-CM | POA: Diagnosis not present

## 2019-06-25 DIAGNOSIS — R87612 Low grade squamous intraepithelial lesion on cytologic smear of cervix (LGSIL): Secondary | ICD-10-CM | POA: Diagnosis not present

## 2019-08-14 DIAGNOSIS — D485 Neoplasm of uncertain behavior of skin: Secondary | ICD-10-CM | POA: Diagnosis not present

## 2019-08-14 DIAGNOSIS — L439 Lichen planus, unspecified: Secondary | ICD-10-CM | POA: Diagnosis not present

## 2019-08-28 DIAGNOSIS — E559 Vitamin D deficiency, unspecified: Secondary | ICD-10-CM | POA: Diagnosis not present

## 2019-08-28 DIAGNOSIS — R5383 Other fatigue: Secondary | ICD-10-CM | POA: Diagnosis not present

## 2019-08-28 DIAGNOSIS — L439 Lichen planus, unspecified: Secondary | ICD-10-CM | POA: Diagnosis not present

## 2019-08-28 DIAGNOSIS — E282 Polycystic ovarian syndrome: Secondary | ICD-10-CM | POA: Diagnosis not present

## 2019-08-28 DIAGNOSIS — Z1159 Encounter for screening for other viral diseases: Secondary | ICD-10-CM | POA: Diagnosis not present

## 2019-12-17 DIAGNOSIS — Z20822 Contact with and (suspected) exposure to covid-19: Secondary | ICD-10-CM | POA: Diagnosis not present

## 2019-12-24 ENCOUNTER — Ambulatory Visit (INDEPENDENT_AMBULATORY_CARE_PROVIDER_SITE_OTHER): Payer: BC Managed Care – PPO | Admitting: Internal Medicine

## 2019-12-24 ENCOUNTER — Other Ambulatory Visit (INDEPENDENT_AMBULATORY_CARE_PROVIDER_SITE_OTHER): Payer: BC Managed Care – PPO

## 2019-12-24 ENCOUNTER — Other Ambulatory Visit: Payer: Self-pay

## 2019-12-24 ENCOUNTER — Encounter: Payer: Self-pay | Admitting: Internal Medicine

## 2019-12-24 VITALS — BP 110/70 | HR 77 | Ht 60.0 in | Wt 124.0 lb

## 2019-12-24 DIAGNOSIS — E559 Vitamin D deficiency, unspecified: Secondary | ICD-10-CM

## 2019-12-24 DIAGNOSIS — E785 Hyperlipidemia, unspecified: Secondary | ICD-10-CM | POA: Diagnosis not present

## 2019-12-24 DIAGNOSIS — E282 Polycystic ovarian syndrome: Secondary | ICD-10-CM

## 2019-12-24 LAB — LIPID PANEL
Cholesterol: 151 mg/dL (ref 0–200)
HDL: 56.2 mg/dL (ref >39.00)
LDL Cholesterol: 85 mg/dL (ref 0–99)
NonHDL: 94.57
Total CHOL/HDL Ratio: 3
Triglycerides: 46 mg/dL (ref 0.0–149.0)
VLDL: 9.2 mg/dL (ref 0.0–40.0)

## 2019-12-24 LAB — COMPREHENSIVE METABOLIC PANEL
ALT: 8 U/L (ref 0–35)
AST: 13 U/L (ref 0–37)
Albumin: 4.3 g/dL (ref 3.5–5.2)
Alkaline Phosphatase: 52 U/L (ref 39–117)
BUN: 14 mg/dL (ref 6–23)
CO2: 27 mEq/L (ref 19–32)
Calcium: 9.2 mg/dL (ref 8.4–10.5)
Chloride: 104 mEq/L (ref 96–112)
Creatinine, Ser: 0.78 mg/dL (ref 0.40–1.20)
GFR: 84.74 mL/min (ref >60.00)
Glucose, Bld: 80 mg/dL (ref 70–99)
Potassium: 3.9 mEq/L (ref 3.5–5.1)
Sodium: 138 mEq/L (ref 135–145)
Total Bilirubin: 0.8 mg/dL (ref 0.2–1.2)
Total Protein: 7.2 g/dL (ref 6.0–8.3)

## 2019-12-24 LAB — HEMOGLOBIN A1C: Hgb A1c MFr Bld: 5.3 % (ref 4.6–6.5)

## 2019-12-24 LAB — VITAMIN D 25 HYDROXY (VIT D DEFICIENCY, FRACTURES): VITD: 25.29 ng/mL — ABNORMAL LOW (ref 30.00–100.00)

## 2019-12-24 NOTE — Patient Instructions (Signed)
Please stop at the lab.  Continue Vitamin D 4000 units and try to take it daily.  Please return in 1 year.

## 2019-12-24 NOTE — Progress Notes (Signed)
Patient ID: Priscilla Rodriguez, female   DOB: 06/02/1985, 34 y.o.   MRN: [id number]  This visit occurred during the SARS-CoV-2 public health emergency.  Safety protocols were in place, including screening questions prior to the visit, additional usage of staff PPE, and extensive cleaning of exam room while observing appropriate contact time as indicated for disinfecting solutions.   HPI: Priscilla Rodriguez is a 34 y.o. female, returning for follow-up for PCOS and vit D def. Last visit 1 year ago.  Reviewed and addended history: She was diagnosed with PCOS by OB/GYN in 2014.  At that time, she had irregular menses and pain during intercourse. She had a TV U/S >> multiple bilateral ovarian cysts.   Weight gain: - Significant reduction in weight before last visit: 119 pounds after starting Ovasitol and cutting down carbs - no steroid use - no weight loss meds - Diets tried: none - saw Denny LevyLaura Reavis (nutrition) - Exercise: - Has sludge in her GB >> had 2 pancreatitis attacks.  No history of hypertriglyceridemia.  Fertility/Menstrual cycles:  - History of irregular menses, now regular - + History of ovarian cysts - children: 0 - miscarriages: 0 - she was on OCP for dysmenorrhea and menorrhagia from 34 y/o >> college >> stopped for 2 years >> dysmenorrhea and irregular menses >> several negative pregnancy tests >> tried NuvaRing in 2010 >> restarted OCPs in 2014: Jolessa, then [employer]inte[employer].  She started 2016. - has uterus didelphis (double uterus)  She stopped OCPs in 08/2014 and she did not want to be on this for life.  She has regular cycles but has Provera at hand.  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 -Plucks it  Treatments tried: -She refused Metformin -Did not try spironolactone - did not try Vaniqua  Other meds: -She was previously on Cymbalta, now off  -Latest CMP was normal:   Chemistry      Component Value  Date/Time   NA 140 12/26/2018 0944   K 4.7 12/26/2018 0944   CL 105 12/26/2018 0944   CO2 26 12/26/2018 0944   BUN 13 12/26/2018 0944   CREATININE 0.73 12/26/2018 0944      Component Value Date/Time   CALCIUM 10.0 12/26/2018 0944   ALKPHOS 81 11/29/2016 0938   AST 12 12/26/2018 0944   ALT 11 12/26/2018 0944   BILITOT 0.8 12/26/2018 0944     -Latest lipid panel was normal: Lab Results  Component Value Date   CHOL 135 12/26/2018   HDL 53.10 12/26/2018   LDLCALC 71 12/26/2018   TRIG 54.0 12/26/2018   CHOLHDL 3 12/26/2018   Reviewed testosterone levels: Component     Latest Ref Rng & Units 12/26/2018  Testosterone, Serum (Total)     ng/dL 23  % Free Testosterone     % 1.1  Free Testosterone, S     pg/mL 2.5  Sex Hormone Binding Globulin     nmol/L 48.5   Previously: Component     Latest Ref Rng & Units 05/05/2014 11/20/2014 11/29/2016 11/29/2017  Testosterone     8 - 48 ng/dL 81.132.3 48 21   Sex Horm Binding Glob, Serum     24.6 - 122.0 nmol/L 146 (H) 24 18.3 (L)   Testosterone Free     0.0 - 4.2 pg/mL 1.9 10.3 (H) 2.5   Testosterone-% Free     0.4 - 2.4 % 0.6 2.1    Testosterone, Serum (Total)  ng/dL    22  % Free Testosterone     %    1.5  Free Testosterone, S     pg/mL    3.3  Sex Hormone Binding Globulin     nmol/L    34.3   Other pertinent labs reviewed: Lab Results  Component Value Date   HGBA1C 5.3 12/26/2018   HGBA1C 5.5 11/29/2017   HGBA1C 5.5 11/29/2016   HGBA1C 5.2 11/20/2014   HGBA1C 5.5 05/05/2014   Lab Results  Component Value Date   PROLACTIN 12.6 05/05/2014   Lab Results  Component Value Date   TSH 0.74 05/05/2014   She has a history of vitamin D deficiency.  Previously on high-dose vitamin D, ergocalciferol weekly, then on vitamin D 2000 units daily, which will increase to 4000 units daily.  At last visit she was occasionally missing doses. Lab Results  Component Value Date   VD25OH 32.13 12/26/2018   VD25OH 21.68 (L) 11/29/2017    VD25OH 23.09 (L) 11/29/2016   VD25OH 18.22 (L) 11/20/2014   VD25OH 17.82 (L) 05/05/2014   She takes B12 and magnesium occasionally.  ROS: Constitutional: no weight gain/no weight loss, no fatigue, no subjective hyperthermia, no subjective hypothermia Eyes: no blurry vision, no xerophthalmia ENT: no sore throat, no nodules palpated in neck, no dysphagia, no odynophagia, no hoarseness Cardiovascular: no CP/no SOB/no palpitations/no leg swelling Respiratory: no cough/no SOB/no wheezing Gastrointestinal: no N/no V/no D/no C/no acid reflux Musculoskeletal: no muscle aches/no joint aches Skin: no rashes, no hair loss Neurological: no tremors/no numbness/no tingling/no dizziness  I reviewed pt's medications, allergies, PMH, social hx, family hx, and changes were documented in the history of present illness. Otherwise, unchanged from my initial visit note.  Past Medical History:  Diagnosis Date  . Dysmenorrhea   . Gallbladder attack   . PCOS (polycystic ovarian syndrome)    Past Surgical History:  Procedure Laterality Date  . TRIGGER FINGER RELEASE     History   Social History  . Marital Status: Single    Spouse Name: N/A    Number of Children: 0   Occupational History  . receptionist   Social History Main Topics  . Smoking status: Never Smoker   . Smokeless tobacco: Never Used  . Alcohol Use: No     Comment: 3-5 times/year.  . Drug Use: No   Current Outpatient Medications  Medication Sig Dispense Refill  . cholecalciferol (VITAMIN D) 1000 units tablet Take 1,000 Units by mouth daily.    . Mefenamic Acid (PONSTEL PO) Take 1 capsule by mouth as needed (for pain.).     Marland Kitchen Multiple Vitamin (MULTIVITAMIN) tablet Take 1 tablet by mouth daily.     No current facility-administered medications for this visit.    No Known Allergies Family History  Problem Relation Age of Onset  . Hyperlipidemia Father   . Cancer Paternal Grandmother        breast  - DM2 in PGM - HTN in  F  PE: BP 110/70   Pulse 77   Ht 5' (1.524 m)   Wt 124 lb (56.2 kg)   SpO2 97%   BMI 24.22 kg/m  Wt Readings from Last 3 Encounters:  12/24/19 124 lb (56.2 kg)  12/26/18 123 lb (55.8 kg)  11/29/17 119 lb 9.6 oz (54.3 kg)   Constitutional: normal weight, in NAD Eyes: PERRLA, EOMI, no exophthalmos ENT: moist mucous membranes, no thyromegaly, no cervical lymphadenopathy Cardiovascular: RRR, No MRG Respiratory: CTA B Gastrointestinal: abdomen soft,  NT, ND, BS+ Musculoskeletal: no deformities, strength intact in all 4 Skin: moist, warm, no rashes, + few acne spots on her chin, no dark terminal hair on chin, + vellum on sideburns, no acanthosis nigricans Neurological: no tremor with outstretched hands, DTR normal in all 4   ASSESSMENT: 1. PCOS  2. HL  3. Vit D def  PLAN: 1. PCOS - Patient with irregular menstrual cycles, hirsutism, acne, multiple ovarian cysts -We discussed about adding Metformin in the past but she wanted to avoid this since she did not think she had insulin resistance.  For now, she continues off the medication.  Latest testosterone level was normal. -Before the coronavirus pandemic she lost a significant amount of weight, 30 pounds by reducing carbs.  Prior to our last visit she gained 4 pounds, but weight stable now.  Her BMI is in the normal range. -She has monthly menstrual cycles but does have Provera at hand.  We discussed at last visit that if she starts having less than 4 menstrual cycles a year, we may need to add back OCPs (in the past, she was on Ortho-Cyclen/Sprintec) -We will continue ovasitol -At this visit we will check: Testosterone, HbA1c, CMP, TSH - I will see her back in 1 year  2. H/o HL -Latest lipid panel from last visit was at goal: Lab Results  Component Value Date   CHOL 135 12/26/2018   HDL 53.10 12/26/2018   LDLCALC 71 12/26/2018   TRIG 54.0 12/26/2018   CHOLHDL 3 12/26/2018  -She does have a history of gallbladder problems  and pancreatitis -She is not on a statin -We will recheck her lipids today  3. Vit D def  -she was previously on ergocalciferol, then we tried over-the-counter vitamin D  -At last visit she was on 4000 units daily but missing doses.  We discussed about switching to a weekly dose but she preferred to stay on daily doses.  I advised her to try to take this consistently. -We will recheck vitamin D level today  Component     Latest Ref Rng & Units 12/24/2019  Sodium     135 - 145 mEq/L 138  Potassium     3.5 - 5.1 mEq/L 3.9  Chloride     96 - 112 mEq/L 104  CO2     19 - 32 mEq/L 27  Glucose     70 - 99 mg/dL 80  BUN     6 - 23 mg/dL 14  Creatinine     6.60 - 1.20 mg/dL 6.30  Total Bilirubin     0.2 - 1.2 mg/dL 0.8  Alkaline Phosphatase     39 - 117 U/L 52  AST     0 - 37 U/L 13  ALT     0 - 35 U/L 8  Total Protein     6.0 - 8.3 g/dL 7.2  Albumin     3.5 - 5.2 g/dL 4.3  GFR     >16.01 mL/min 84.74  Calcium     8.4 - 10.5 mg/dL 9.2  Cholesterol     0 - 200 mg/dL 093  Triglycerides     0 - 149 mg/dL 23.5  HDL Cholesterol     >39.00 mg/dL 57.32  VLDL     0.0 - 20.2 mg/dL 9.2  LDL (calc)     0 - 99 mg/dL 85  Total CHOL/HDL Ratio      3  NonHDL      94.57  Testosterone, Serum (Total)     ng/dL 33  % Free Testosterone     % 1.1  Free Testosterone, S     pg/mL 3.6  Sex Hormone Binding Globulin     nmol/L 35.4  Hemoglobin A1C     4.6 - 6.5 % 5.3  VITD     30.00 - 100.00 ng/mL 25.29 (L)   Labs are at goal with exception of a still low vitamin D.  We will increase the dose of vitamin D to 5000 units daily.  Carlus Pavlov, MD PhD [facility] Endocrinology

## 2019-12-30 LAB — TESTOSTERONE, FREE AND TOTAL (INCLUDES SHBG)-(MALES)
% Free Testosterone: 1.1 %
Free Testosterone, S: 3.6 pg/mL
Sex Hormone Binding Globulin: 35.4 nmol/L
Testosterone, Serum (Total): 33 ng/dL

## 2020-01-23 DIAGNOSIS — R05 Cough: Secondary | ICD-10-CM | POA: Diagnosis not present

## 2020-08-18 DIAGNOSIS — J029 Acute pharyngitis, unspecified: Secondary | ICD-10-CM | POA: Diagnosis not present

## 2020-08-19 DIAGNOSIS — F4323 Adjustment disorder with mixed anxiety and depressed mood: Secondary | ICD-10-CM | POA: Diagnosis not present

## 2020-08-31 DIAGNOSIS — L308 Other specified dermatitis: Secondary | ICD-10-CM | POA: Diagnosis not present

## 2020-09-07 DIAGNOSIS — F4323 Adjustment disorder with mixed anxiety and depressed mood: Secondary | ICD-10-CM | POA: Diagnosis not present

## 2020-09-15 DIAGNOSIS — F4323 Adjustment disorder with mixed anxiety and depressed mood: Secondary | ICD-10-CM | POA: Diagnosis not present

## 2020-09-21 DIAGNOSIS — F4323 Adjustment disorder with mixed anxiety and depressed mood: Secondary | ICD-10-CM | POA: Diagnosis not present

## 2020-09-28 DIAGNOSIS — F4323 Adjustment disorder with mixed anxiety and depressed mood: Secondary | ICD-10-CM | POA: Diagnosis not present

## 2020-10-06 DIAGNOSIS — F4323 Adjustment disorder with mixed anxiety and depressed mood: Secondary | ICD-10-CM | POA: Diagnosis not present

## 2020-10-12 DIAGNOSIS — F4322 Adjustment disorder with anxiety: Secondary | ICD-10-CM | POA: Diagnosis not present

## 2020-10-20 DIAGNOSIS — F4323 Adjustment disorder with mixed anxiety and depressed mood: Secondary | ICD-10-CM | POA: Diagnosis not present

## 2020-10-26 DIAGNOSIS — F4323 Adjustment disorder with mixed anxiety and depressed mood: Secondary | ICD-10-CM | POA: Diagnosis not present

## 2020-11-02 DIAGNOSIS — F4323 Adjustment disorder with mixed anxiety and depressed mood: Secondary | ICD-10-CM | POA: Diagnosis not present

## 2020-11-11 DIAGNOSIS — F4323 Adjustment disorder with mixed anxiety and depressed mood: Secondary | ICD-10-CM | POA: Diagnosis not present

## 2020-11-16 DIAGNOSIS — F4323 Adjustment disorder with mixed anxiety and depressed mood: Secondary | ICD-10-CM | POA: Diagnosis not present

## 2020-11-25 DIAGNOSIS — F4323 Adjustment disorder with mixed anxiety and depressed mood: Secondary | ICD-10-CM | POA: Diagnosis not present

## 2020-12-02 DIAGNOSIS — F4323 Adjustment disorder with mixed anxiety and depressed mood: Secondary | ICD-10-CM | POA: Diagnosis not present

## 2020-12-14 DIAGNOSIS — F4323 Adjustment disorder with mixed anxiety and depressed mood: Secondary | ICD-10-CM | POA: Diagnosis not present

## 2020-12-23 ENCOUNTER — Encounter: Payer: Self-pay | Admitting: Internal Medicine

## 2020-12-23 ENCOUNTER — Other Ambulatory Visit: Payer: Self-pay

## 2020-12-23 ENCOUNTER — Ambulatory Visit: Payer: BC Managed Care – PPO | Admitting: Internal Medicine

## 2020-12-23 VITALS — BP 118/68 | HR 86 | Ht 60.0 in | Wt 139.2 lb

## 2020-12-23 DIAGNOSIS — E282 Polycystic ovarian syndrome: Secondary | ICD-10-CM | POA: Diagnosis not present

## 2020-12-23 DIAGNOSIS — E785 Hyperlipidemia, unspecified: Secondary | ICD-10-CM | POA: Diagnosis not present

## 2020-12-23 DIAGNOSIS — E559 Vitamin D deficiency, unspecified: Secondary | ICD-10-CM

## 2020-12-23 LAB — LIPID PANEL
Cholesterol: 176 mg/dL (ref 0–200)
HDL: 62.3 mg/dL (ref >39.00)
LDL Cholesterol: 104 mg/dL — ABNORMAL HIGH (ref 0–99)
NonHDL: 113.45
Total CHOL/HDL Ratio: 3
Triglycerides: 48 mg/dL (ref 0.0–149.0)
VLDL: 9.6 mg/dL (ref 0.0–40.0)

## 2020-12-23 LAB — COMPREHENSIVE METABOLIC PANEL
ALT: 10 U/L (ref 0–35)
AST: 14 U/L (ref 0–37)
Albumin: 4.4 g/dL (ref 3.5–5.2)
Alkaline Phosphatase: 55 U/L (ref 39–117)
BUN: 17 mg/dL (ref 6–23)
CO2: 24 mEq/L (ref 19–32)
Calcium: 9.7 mg/dL (ref 8.4–10.5)
Chloride: 106 mEq/L (ref 96–112)
Creatinine, Ser: 0.81 mg/dL (ref 0.40–1.20)
GFR: 94.58 mL/min (ref >60.00)
Glucose, Bld: 82 mg/dL (ref 70–99)
Potassium: 4.1 mEq/L (ref 3.5–5.1)
Sodium: 138 mEq/L (ref 135–145)
Total Bilirubin: 0.7 mg/dL (ref 0.2–1.2)
Total Protein: 7.5 g/dL (ref 6.0–8.3)

## 2020-12-23 LAB — HEMOGLOBIN A1C: Hgb A1c MFr Bld: 5.5 % (ref 4.6–6.5)

## 2020-12-23 LAB — TSH: TSH: 1.23 u[IU]/mL (ref 0.35–5.50)

## 2020-12-23 NOTE — Patient Instructions (Addendum)
Please stop at the lab.  Continue Vitamin D 4000 units daily.  Please return in 1 year.

## 2020-12-23 NOTE — Progress Notes (Addendum)
Patient ID: Priscilla Rodriguez, female   DOB: 07/18/1985, 35 y.o.   MRN: [id number]  This visit occurred during the SARS-CoV-2 public health emergency.  Safety protocols were in place, including screening questions prior to the visit, additional usage of staff PPE, and extensive cleaning of exam room while observing appropriate contact time as indicated for disinfecting solutions.   HPI: Priscilla Rodriguez is a 35 y.o. female, returning for follow-up for PCOS and vit D def. Last visit 1 year ago.  Interim history: Menses are still monthly. LMP 12/06/2020. She gained 15 lbs since last OV.  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 She feels that her acne and hirsutism are stable. Otherwise, she has no complaints at this visit.  Reviewed and addended history: She was diagnosed with PCOS by OB/GYN in 2014.  At that time, she had irregular menses and pain during intercourse. She had a TV U/S >> multiple bilateral ovarian cysts.   Weight gain: - Significant reduction in weight in 2020-2021: Lowest weight: 119 pounds after starting Ovasitol and cutting down carbs.  - no steroid use - no weight loss meds - Diets tried: none - saw Denny LevyLaura Reavis (nutrition) - Exercise: - Has sludge in her GB >> had 2 pancreatitis attacks.  No history of hypertriglyceridemia.  Fertility/Menstrual cycles:  - History of irregular menses, now regular - + History of ovarian cysts - children: 0 - miscarriages: 0 - she was on OCP for dysmenorrhea and menorrhagia from 35 y/o >> college >> stopped for 2 years >> dysmenorrhea and irregular menses >> several negative pregnancy tests >> tried NuvaRing in 2010 >> restarted OCPs in 2014: Jolessa, then [employer].  She started 2016. - has uterus didelphis (double uterus)  She stopped OCPs in 08/2014 and she did not want to be on this for life.  She has regular cycles but has Provera at hand.  Acne: -Previously  severe on face, mostly on chin, but resolved after reducing carbs  Hirsutism: -Genetic -On chin -Plucks it every day or every other day  Treatments tried: -She refused Metformin -Did not try spironolactone -did not try [place] -on Ovasitol  Other meds: -She was previously on Cymbalta, now off  -Latest CMP was normal:   Chemistry      Component Value Date/Time   NA 138 12/24/2019 0947   K 3.9 12/24/2019 0947   CL 104 12/24/2019 0947   CO2 27 12/24/2019 0947   BUN 14 12/24/2019 0947   CREATININE 0.78 12/24/2019 0947   CREATININE 0.73 12/26/2018 0944      Component Value Date/Time   CALCIUM 9.2 12/24/2019 0947   ALKPHOS 52 12/24/2019 0947   AST 13 12/24/2019 0947   ALT 8 12/24/2019 0947   BILITOT 0.8 12/24/2019 0947     -Latest lipid panel was at goal: Lab Results  Component Value Date   CHOL 151 12/24/2019   HDL 56.20 12/24/2019   LDLCALC 85 12/24/2019   TRIG 46.0 12/24/2019   CHOLHDL 3 12/24/2019   Reviewed testosterone levels: Component     Latest Ref Rng & Units 12/24/2019  Testosterone, Serum (Total)     ng/dL 33  % Free Testosterone     % 1.1  Free Testosterone, S     pg/mL 3.6  Sex Hormone Binding Globulin     nmol/L 35.4   Previously: Component     Latest Ref Rng & Units 12/26/2018  Testosterone,  Serum (Total)     ng/dL 23  % Free Testosterone     % 1.1  Free Testosterone, S     pg/mL 2.5  Sex Hormone Binding Globulin     nmol/L 48.5   Component     Latest Ref Rng & Units 05/05/2014 11/20/2014 11/29/2016 11/29/2017  Testosterone     8 - 48 ng/dL 21.3 48 21   Sex Horm Binding Glob, Serum     24.6 - 122.0 nmol/L 146 (H) 24 18.3 (L)   Testosterone Free     0.0 - 4.2 pg/mL 1.9 10.3 (H) 2.5   Testosterone-% Free     0.4 - 2.4 % 0.6 2.1    Testosterone, Serum (Total)     ng/dL    22  % Free Testosterone     %    1.5  Free Testosterone, S     pg/mL    3.3  Sex Hormone Binding Globulin     nmol/L    34.3   Other pertinent labs  reviewed: Lab Results  Component Value Date   HGBA1C 5.3 12/24/2019   HGBA1C 5.3 12/26/2018   HGBA1C 5.5 11/29/2017   HGBA1C 5.5 11/29/2016   HGBA1C 5.2 11/20/2014   HGBA1C 5.5 05/05/2014   Lab Results  Component Value Date   PROLACTIN 12.6 05/05/2014   Lab Results  Component Value Date   TSH 0.74 05/05/2014   She has a history of vitamin D deficiency.  Previously on high-dose vitamin D, ergocalciferol weekly, then on vitamin D 2000 units daily, which we increased to 4000 units daily.  I advised her to increase the dose to 5000 units daily.  However, she continues on 4000 units -but still forgets doses. Lab Results  Component Value Date   VD25OH 25.29 (L) 12/24/2019   VD25OH 32.13 12/26/2018   VD25OH 21.68 (L) 11/29/2017   VD25OH 23.09 (L) 11/29/2016   VD25OH 18.22 (L) 11/20/2014   VD25OH 17.82 (L) 05/05/2014   She takes potassium occasionally as needed for muscle cramps.  ROS: + See HPI  I reviewed pt's medications, allergies, PMH, social hx, family hx, and changes were documented in the history of present illness. Otherwise, unchanged from my initial visit note.  Past Medical History:  Diagnosis Date   Dysmenorrhea    Gallbladder attack    PCOS (polycystic ovarian syndrome)    Past Surgical History:  Procedure Laterality Date   TRIGGER FINGER RELEASE     History   Social History   Marital Status: Single    Spouse Name: N/A    Number of Children: 0   Occupational History   receptionist   Social History Main Topics   Smoking status: Never Smoker    Smokeless tobacco: Never Used   Alcohol Use: No     Comment: 3-5 times/year.   Drug Use: No   Current Outpatient Medications  Medication Sig Dispense Refill   cholecalciferol (VITAMIN D) 1000 units tablet Take 1,000 Units by mouth daily.     Mefenamic Acid (PONSTEL PO) Take 1 capsule by mouth as needed (for pain.).      Multiple Vitamin (MULTIVITAMIN) tablet Take 1 tablet by mouth daily.     No current  facility-administered medications for this visit.    No Known Allergies Family History  Problem Relation Age of Onset   Hyperlipidemia Father    Cancer Paternal Grandmother        breast  - DM2 in PGM - HTN in F  PE:  BP 118/68 (BP Location: Right Arm, Patient Position: Sitting, Cuff Size: Normal)   Pulse 86   Ht 5' (1.524 m)   Wt 139 lb 3.2 oz (63.1 kg)   SpO2 99%   BMI 27.19 kg/m  Wt Readings from Last 3 Encounters:  12/23/20 139 lb 3.2 oz (63.1 kg)  12/24/19 124 lb (56.2 kg)  12/26/18 123 lb (55.8 kg)   Constitutional: normal weight, in NAD Eyes: PERRLA, EOMI, no exophthalmos ENT: moist mucous membranes, no thyromegaly, no cervical lymphadenopathy Cardiovascular: RRR, No MRG Respiratory: CTA B Gastrointestinal: abdomen soft, NT, ND, BS+ Musculoskeletal: no deformities, strength intact in all 4 Skin: moist, warm, no rashes, + few acne spots on her chin, no dark terminal hair on chin, + vellum on sideburns, no acanthosis nigricans Neurological: no tremor with outstretched hands, DTR normal in all 4  ASSESSMENT: 1. PCOS  2. HL  3. Vit D def  PLAN: 1. PCOS -Patient with history of irregular menstrual cycles, hirsutism, acne, multiple ovarian cysts -In the past, we tried adding metformin but she wanted to avoid this as she did not think she had insulin resistance.  Latest testosterone level was normal. -Before the coronavirus pandemic, she lost a significant amount of weight, 30 pounds, by reducing carbs.  She maintained the majority of her weight loss before last visit.  However, since then, she gained 15 pounds due to increased stress at work.  We discussed about starting to improve diet and also trying to incorporate exercise 5 times a week. -She continues to have monthly menstrual cycles and has Provera at hand.  We discussed that if she has less than 4 menstrual cycles a year, we may need to add back OCPs (in the past, she was on Ortho-Cyclen/Sprintec) -No  significant increase in acne or hirsutism since last visit -We will continue Ovasitol for now.  We discussed that this is helping to maintain insulin sensitivity.  HbA1c was 5.3% at last visit. -I did advise her to let me know if she wants to try metformin before next visit -At today's visit we will check a testosterone, HbA1c, CMP, TSH, lipid panel and vitamin D -I will see her back in 1 year  2. H/o HL -Her latest lipid panel was at goal Lab Results  Component Value Date   CHOL 151 12/24/2019   HDL 56.20 12/24/2019   LDLCALC 85 12/24/2019   TRIG 46.0 12/24/2019   CHOLHDL 3 12/24/2019  -She does have a history of gallbladder disease and pancreatitis -She is not on a statin -We will recheck her lipid panel today-fasting  3. Vit D def  -No muscle aches -She was previously on ergocalciferol, then over-the-counter vitamin D -At last visit, she was on 4000 units vitamin D daily, but not taken consistently -Of note, she prefers a daily dose to a weekly dose as she is afraid that she will completely forget to take the weekly formulation -At last visit vitamin D was still low, at 25.29 and I advised her to increase the dose to 5000 units daily and take it consistently >> she still takes 4000 units but continues to miss doses -We will recheck her level today  Component     Latest Ref Rng & Units 12/23/2020  Sodium     135 - 145 mEq/L 138  Potassium     3.5 - 5.1 mEq/L 4.1  Chloride     96 - 112 mEq/L 106  CO2     19 - 32 mEq/L 24  Glucose     70 - 99 mg/dL 82  BUN     6 - 23 mg/dL 17  Creatinine     1.49 - 1.20 mg/dL 7.02  Total Bilirubin     0.2 - 1.2 mg/dL 0.7  Alkaline Phosphatase     39 - 117 U/L 55  AST     0 - 37 U/L 14  ALT     0 - 35 U/L 10  Total Protein     6.0 - 8.3 g/dL 7.5  Albumin     3.5 - 5.2 g/dL 4.4  GFR     >63.78 mL/min 94.58  Calcium     8.4 - 10.5 mg/dL 9.7  Cholesterol     0 - 200 mg/dL 588  Triglycerides     0.0 - 149.0 mg/dL 50.2  HDL  Cholesterol     >39.00 mg/dL 77.41  VLDL     0.0 - 28.7 mg/dL 9.6  LDL (calc)     0 - 99 mg/dL 867 (H)  Total CHOL/HDL Ratio      3  NonHDL      113.45  Testosterone, Serum (Total)     ng/dL 16  % Free Testosterone     % 0.7  Free Testosterone, S     pg/mL 1.1  Sex Hormone Binding Globulin     nmol/L 37.3  Hemoglobin A1C     4.6 - 6.5 % 5.5  TSH     0.35 - 5.50 uIU/mL 1.23  Vitamin D, 25-Hydroxy     30.0 - 100.0 ng/mL 26.0 (L)   Message sent: Dear Marcelino Duster, Your vitamin D level is still low.  Please make sure that you are taking the vitamin D every day.  You may need a pillbox for this. Your LDL is higher than before, slightly above target.  Improving diet by reducing animal products and increasing legumes, whole grains, vegetables, and fruit along with increasing exercise should definitely help. Your thyroid tests are excellent.  Testosterone level is normal.  The rest of the labs are also at goal. Sincerely, Carlus Pavlov MD  Carlus Pavlov, MD PhD [facility] Endocrinology

## 2020-12-24 DIAGNOSIS — F4323 Adjustment disorder with mixed anxiety and depressed mood: Secondary | ICD-10-CM | POA: Diagnosis not present

## 2020-12-28 DIAGNOSIS — F4323 Adjustment disorder with mixed anxiety and depressed mood: Secondary | ICD-10-CM | POA: Diagnosis not present

## 2020-12-31 LAB — VITAMIN D 25 HYDROXY (VIT D DEFICIENCY, FRACTURES): Vit D, 25-Hydroxy: 26 ng/mL — ABNORMAL LOW (ref 30.0–100.0)

## 2020-12-31 LAB — TESTOSTERONE, FREE AND TOTAL (INCLUDES SHBG)-(MALES)
% Free Testosterone: 0.7 %
Free Testosterone, S: 1.1 pg/mL
Sex Hormone Binding Globulin: 37.3 nmol/L
Testosterone, Serum (Total): 16 ng/dL

## 2021-01-03 DIAGNOSIS — F4323 Adjustment disorder with mixed anxiety and depressed mood: Secondary | ICD-10-CM | POA: Diagnosis not present

## 2021-01-04 DIAGNOSIS — Z6826 Body mass index (BMI) 26.0-26.9, adult: Secondary | ICD-10-CM | POA: Diagnosis not present

## 2021-01-04 DIAGNOSIS — Z01419 Encounter for gynecological examination (general) (routine) without abnormal findings: Secondary | ICD-10-CM | POA: Diagnosis not present

## 2021-01-04 DIAGNOSIS — Z113 Encounter for screening for infections with a predominantly sexual mode of transmission: Secondary | ICD-10-CM | POA: Diagnosis not present

## 2021-01-17 DIAGNOSIS — F4323 Adjustment disorder with mixed anxiety and depressed mood: Secondary | ICD-10-CM | POA: Diagnosis not present

## 2021-01-24 DIAGNOSIS — F4323 Adjustment disorder with mixed anxiety and depressed mood: Secondary | ICD-10-CM | POA: Diagnosis not present

## 2021-02-03 DIAGNOSIS — F4323 Adjustment disorder with mixed anxiety and depressed mood: Secondary | ICD-10-CM | POA: Diagnosis not present

## 2021-02-16 ENCOUNTER — Telehealth: Payer: Self-pay

## 2021-02-16 NOTE — Telephone Encounter (Signed)
Inbound fax from [facility] requesting medical necessity justification from provider has been signed and faxed over. Fax #: [phone]

## 2021-02-18 DIAGNOSIS — F4323 Adjustment disorder with mixed anxiety and depressed mood: Secondary | ICD-10-CM | POA: Diagnosis not present

## 2021-02-23 DIAGNOSIS — F4323 Adjustment disorder with mixed anxiety and depressed mood: Secondary | ICD-10-CM | POA: Diagnosis not present

## 2021-03-03 DIAGNOSIS — F4323 Adjustment disorder with mixed anxiety and depressed mood: Secondary | ICD-10-CM | POA: Diagnosis not present

## 2021-03-11 DIAGNOSIS — F4323 Adjustment disorder with mixed anxiety and depressed mood: Secondary | ICD-10-CM | POA: Diagnosis not present

## 2021-03-17 DIAGNOSIS — F4323 Adjustment disorder with mixed anxiety and depressed mood: Secondary | ICD-10-CM | POA: Diagnosis not present

## 2021-03-24 DIAGNOSIS — F4323 Adjustment disorder with mixed anxiety and depressed mood: Secondary | ICD-10-CM | POA: Diagnosis not present

## 2021-03-30 DIAGNOSIS — F4323 Adjustment disorder with mixed anxiety and depressed mood: Secondary | ICD-10-CM | POA: Diagnosis not present

## 2021-04-07 DIAGNOSIS — F4323 Adjustment disorder with mixed anxiety and depressed mood: Secondary | ICD-10-CM | POA: Diagnosis not present

## 2021-04-14 DIAGNOSIS — F4323 Adjustment disorder with mixed anxiety and depressed mood: Secondary | ICD-10-CM | POA: Diagnosis not present

## 2021-04-21 DIAGNOSIS — F4323 Adjustment disorder with mixed anxiety and depressed mood: Secondary | ICD-10-CM | POA: Diagnosis not present

## 2021-04-25 DIAGNOSIS — F4323 Adjustment disorder with mixed anxiety and depressed mood: Secondary | ICD-10-CM | POA: Diagnosis not present

## 2021-05-04 DIAGNOSIS — F4323 Adjustment disorder with mixed anxiety and depressed mood: Secondary | ICD-10-CM | POA: Diagnosis not present

## 2021-05-12 DIAGNOSIS — F4323 Adjustment disorder with mixed anxiety and depressed mood: Secondary | ICD-10-CM | POA: Diagnosis not present

## 2021-05-18 DIAGNOSIS — F4323 Adjustment disorder with mixed anxiety and depressed mood: Secondary | ICD-10-CM | POA: Diagnosis not present

## 2021-05-25 DIAGNOSIS — F4323 Adjustment disorder with mixed anxiety and depressed mood: Secondary | ICD-10-CM | POA: Diagnosis not present

## 2021-06-01 DIAGNOSIS — F4323 Adjustment disorder with mixed anxiety and depressed mood: Secondary | ICD-10-CM | POA: Diagnosis not present

## 2021-06-08 DIAGNOSIS — F4323 Adjustment disorder with mixed anxiety and depressed mood: Secondary | ICD-10-CM | POA: Diagnosis not present

## 2021-06-16 DIAGNOSIS — F4323 Adjustment disorder with mixed anxiety and depressed mood: Secondary | ICD-10-CM | POA: Diagnosis not present

## 2021-06-21 DIAGNOSIS — F988 Other specified behavioral and emotional disorders with onset usually occurring in childhood and adolescence: Secondary | ICD-10-CM | POA: Diagnosis not present

## 2021-06-22 DIAGNOSIS — F4323 Adjustment disorder with mixed anxiety and depressed mood: Secondary | ICD-10-CM | POA: Diagnosis not present

## 2021-06-30 DIAGNOSIS — F4323 Adjustment disorder with mixed anxiety and depressed mood: Secondary | ICD-10-CM | POA: Diagnosis not present

## 2021-07-04 DIAGNOSIS — L292 Pruritus vulvae: Secondary | ICD-10-CM | POA: Diagnosis not present

## 2021-07-04 DIAGNOSIS — Z113 Encounter for screening for infections with a predominantly sexual mode of transmission: Secondary | ICD-10-CM | POA: Diagnosis not present

## 2021-07-06 DIAGNOSIS — F4323 Adjustment disorder with mixed anxiety and depressed mood: Secondary | ICD-10-CM | POA: Diagnosis not present

## 2021-07-15 DIAGNOSIS — F4323 Adjustment disorder with mixed anxiety and depressed mood: Secondary | ICD-10-CM | POA: Diagnosis not present

## 2021-07-20 DIAGNOSIS — F4323 Adjustment disorder with mixed anxiety and depressed mood: Secondary | ICD-10-CM | POA: Diagnosis not present

## 2021-07-27 DIAGNOSIS — F4323 Adjustment disorder with mixed anxiety and depressed mood: Secondary | ICD-10-CM | POA: Diagnosis not present

## 2021-08-02 DIAGNOSIS — F988 Other specified behavioral and emotional disorders with onset usually occurring in childhood and adolescence: Secondary | ICD-10-CM | POA: Diagnosis not present

## 2021-08-04 DIAGNOSIS — F4323 Adjustment disorder with mixed anxiety and depressed mood: Secondary | ICD-10-CM | POA: Diagnosis not present

## 2021-08-10 DIAGNOSIS — F4323 Adjustment disorder with mixed anxiety and depressed mood: Secondary | ICD-10-CM | POA: Diagnosis not present

## 2021-08-17 DIAGNOSIS — F4323 Adjustment disorder with mixed anxiety and depressed mood: Secondary | ICD-10-CM | POA: Diagnosis not present

## 2021-08-24 DIAGNOSIS — F4323 Adjustment disorder with mixed anxiety and depressed mood: Secondary | ICD-10-CM | POA: Diagnosis not present

## 2021-08-31 DIAGNOSIS — F4323 Adjustment disorder with mixed anxiety and depressed mood: Secondary | ICD-10-CM | POA: Diagnosis not present

## 2021-09-05 DIAGNOSIS — F4323 Adjustment disorder with mixed anxiety and depressed mood: Secondary | ICD-10-CM | POA: Diagnosis not present

## 2021-10-24 DIAGNOSIS — F4323 Adjustment disorder with mixed anxiety and depressed mood: Secondary | ICD-10-CM | POA: Diagnosis not present

## 2021-12-23 ENCOUNTER — Encounter: Payer: Self-pay | Admitting: Internal Medicine

## 2021-12-23 ENCOUNTER — Ambulatory Visit: Payer: BC Managed Care – PPO | Admitting: Internal Medicine

## 2021-12-23 VITALS — BP 110/72 | HR 84 | Ht 60.0 in | Wt 136.6 lb

## 2021-12-23 DIAGNOSIS — E785 Hyperlipidemia, unspecified: Secondary | ICD-10-CM | POA: Diagnosis not present

## 2021-12-23 DIAGNOSIS — E559 Vitamin D deficiency, unspecified: Secondary | ICD-10-CM | POA: Diagnosis not present

## 2021-12-23 DIAGNOSIS — E282 Polycystic ovarian syndrome: Secondary | ICD-10-CM | POA: Diagnosis not present

## 2021-12-23 LAB — LIPID PANEL
Cholesterol: 163 mg/dL (ref 0–200)
HDL: 54.8 mg/dL (ref >39.00)
LDL Cholesterol: 99 mg/dL (ref 0–99)
NonHDL: 108.36
Total CHOL/HDL Ratio: 3
Triglycerides: 47 mg/dL (ref 0.0–149.0)
VLDL: 9.4 mg/dL (ref 0.0–40.0)

## 2021-12-23 LAB — HEMOGLOBIN A1C: Hgb A1c MFr Bld: 5.6 % (ref 4.6–6.5)

## 2021-12-23 LAB — TSH: TSH: 1.23 u[IU]/mL (ref 0.35–5.50)

## 2021-12-23 LAB — VITAMIN D 25 HYDROXY (VIT D DEFICIENCY, FRACTURES): VITD: 16.47 ng/mL — ABNORMAL LOW (ref 30.00–100.00)

## 2021-12-23 NOTE — Progress Notes (Signed)
Patient ID: Priscilla Rodriguez, female   DOB: 06/02/1985, 36 y.o.   MRN: [id number]  HPI: Priscilla Rodriguez is a 36 y.o. female, returning for follow-up for PCOS and vit D def. Last visit 1 year ago.  Interim history: She still has monthly menstrual cycles. She feels that her acne and hirsutism are ultimately stable. She had signif. 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 Now on Vyvanse. Off Ovasitol after running out 2 mo ago. Off vitamin D, too.  Reviewed and addended history: She was diagnosed with PCOS by OB/GYN in 2014.  At that time, she had irregular menses and pain during intercourse. She had a TV U/S >> multiple bilateral ovarian cysts.   Weight gain: - Significant reduction in weight in 2020-2021: Lowest weight: 119 pounds after starting Ovasitol and cutting down carbs.  - no steroid use - no weight loss meds - Diets tried: none - saw Ozzie Hoyle (nutrition) - Exercise: - Has sludge in her GB >> had 2 pancreatitis attacks.  No history of hypertriglyceridemia.  Fertility/Menstrual cycles:  - History of irregular menses, now regular - + History of ovarian cysts - children: 0 - miscarriages: 0 - she was on OCP for dysmenorrhea and menorrhagia from 36 y/o >> college >> stopped for 2 years >> dysmenorrhea and irregular menses >> several negative pregnancy tests >> tried NuvaRing in 2010 >> restarted OCPs in 2014: Jolessa, then [employer]  She started 2016.  Afterwards, came off OCPs, and had Provera at hand but did not have to use it. - has uterus didelphis (double uterus)  She stopped OCPs in 08/2014 and she did not want to be on this for life.  She has regular cycles but has Provera at hand.  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 -Plucks it every day or every other day  Treatments tried: -She declined metformin -off OCPs -Did not try spironolactone -did not try Vaniqua -off Ovasitol -stopped  10/2021, when she ran out  Other meds: -She was previously on Cymbalta, now off -now on Vyvanse  -Latest CMP was normal:   Chemistry      Component Value Date/Time   NA 138 12/23/2020 0935   K 4.1 12/23/2020 0935   CL 106 12/23/2020 0935   CO2 24 12/23/2020 0935   BUN 17 12/23/2020 0935   CREATININE 0.81 12/23/2020 0935   CREATININE 0.73 12/26/2018 0944      Component Value Date/Time   CALCIUM 9.7 12/23/2020 0935   ALKPHOS 55 12/23/2020 0935   AST 14 12/23/2020 0935   ALT 10 12/23/2020 0935   BILITOT 0.7 12/23/2020 0935     -Latest lipid panel: Lab Results  Component Value Date   CHOL 176 12/23/2020   HDL 62.30 12/23/2020   LDLCALC 104 (H) 12/23/2020   TRIG 48.0 12/23/2020   CHOLHDL 3 12/23/2020   Reviewed testosterone levels: Component     Latest Ref Rng 12/23/2020  Testosterone, Serum (Total)     ng/dL 16   % Free Testosterone     % 0.7   Free Testosterone, S     pg/mL 1.1   Sex Hormone Binding Globulin     nmol/L 37.3 (C)    Component     Latest Ref Rng & Units 12/24/2019  Testosterone, Serum (Total)     ng/dL 33  % Free Testosterone     % 1.1  Free Testosterone, S     pg/mL  3.6  Sex Hormone Binding Globulin     nmol/L 35.4   Component     Latest Ref Rng & Units 12/26/2018  Testosterone, Serum (Total)     ng/dL 23  % Free Testosterone     % 1.1  Free Testosterone, S     pg/mL 2.5  Sex Hormone Binding Globulin     nmol/L 48.5  ...  Other pertinent labs reviewed: Lab Results  Component Value Date   HGBA1C 5.5 12/23/2020   HGBA1C 5.3 12/24/2019   HGBA1C 5.3 12/26/2018   HGBA1C 5.5 11/29/2017   HGBA1C 5.5 11/29/2016   HGBA1C 5.2 11/20/2014   HGBA1C 5.5 05/05/2014   Lab Results  Component Value Date   PROLACTIN 12.6 05/05/2014   Lab Results  Component Value Date   TSH 1.23 12/23/2020   TSH 0.74 05/05/2014   She has a history of vitamin D deficiency.  Previously on high-dose vitamin D, ergocalciferol weekly, then on vitamin D 2000  units daily, which we increased to 4000 units daily >> off now. Lab Results  Component Value Date   VD25OH 26.0 (L) 12/23/2020   VD25OH 25.29 (L) 12/24/2019   VD25OH 32.13 12/26/2018   VD25OH 21.68 (L) 11/29/2017   VD25OH 23.09 (L) 11/29/2016   VD25OH 18.22 (L) 11/20/2014   VD25OH 17.82 (L) 05/05/2014   She takes potassium occasionally as needed for muscle cramps.  ROS: + See HPI  I reviewed pt's medications, allergies, PMH, social hx, family hx, and changes were documented in the history of present illness. Otherwise, unchanged from my initial visit note.  Past Medical History:  Diagnosis Date   Dysmenorrhea    Gallbladder attack    PCOS (polycystic ovarian syndrome)    Past Surgical History:  Procedure Laterality Date   TRIGGER FINGER RELEASE     History   Social History   Marital Status: Single    Spouse Name: N/A    Number of Children: 0   Occupational History   receptionist   Social History Main Topics   Smoking status: Never Smoker    Smokeless tobacco: Never Used   Alcohol Use: No     Comment: 3-5 times/year.   Drug Use: No   Current Outpatient Medications  Medication Sig Dispense Refill   cholecalciferol (VITAMIN D) 1000 units tablet Take 1,000 Units by mouth daily.     Mefenamic Acid (PONSTEL PO) Take 1 capsule by mouth as needed (for pain.).      Multiple Vitamin (MULTIVITAMIN) tablet Take 1 tablet by mouth daily.     No current facility-administered medications for this visit.    Allergies  Allergen Reactions   Penicillins Rash   Family History  Problem Relation Age of Onset   Hyperlipidemia Father    Cancer Paternal Grandmother        breast  - DM2 in PGM - HTN in F  PE: BP 110/72 (BP Location: Right Arm, Patient Position: Sitting, Cuff Size: Normal)   Pulse 84   Ht 5' (1.524 m)   Wt 136 lb 9.6 oz (62 kg)   SpO2 98%   BMI 26.68 kg/m  Wt Readings from Last 3 Encounters:  12/23/21 136 lb 9.6 oz (62 kg)  12/23/20 139 lb 3.2 oz (63.1  kg)  12/24/19 124 lb (56.2 kg)   Constitutional: normal weight, in NAD Eyes: EOMI, no exophthalmos ENT: moist mucous membranes, no thyromegaly, no cervical lymphadenopathy Cardiovascular: RRR, No MRG Respiratory: CTA B Musculoskeletal: no deformities Skin: moist, warm, no  rashes, + few acne spots on her chin, no dark terminal hair on chin, + vellum on sideburns, no acanthosis nigricans Neurological: no tremor with outstretched hands  ASSESSMENT: 1. PCOS  2. HL  3. Vit D def  PLAN: 1. PCOS -Patient with history of irregular menstrual cycles, hirsutism, multiple ovarian cysts -Testosterone level was normal, at last visit -Before the coronavirus pandemic, she lost a significant amount of weight, 30 pounds, by reducing carbs.  She maintained the majority of her weight loss before last visit.  However, afterwards, she gained 15 pounds due to increased stress at work.  We discussed about starting to improve diet and also trying to incorporate exercise 5 times a week.  She was not able to do so due to increased stress at work and changing jobs. -He continues to have monthly menstrual cycles and has Provera at hand.  We discussed that if she has less than 4 menstrual cycles a year, we may need to add back OCPs (in the past, she was on Ortho-Cyclen/Sprintec). -no significant increase in acne or hirsutism since last visit -We did discuss about metformin, but she declined trying this as she did not believe she had insulin resistance -She was previously on Ovasitol, but ran out 2 months ago.  We discussed that this is helping to maintain insulin sensitivity.  -HbA1c was normal at last visit, at 5.5%.  We will repeat this today. -At today's visit, we will repeat his testosterone level, HbA1c, CMP, TSH, lipid panel, and vitamin D -I will see her back in 1 year  2. H/o HL -Her latest lipid panel showed a slightly high LDL: Lab Results  Component Value Date   CHOL 176 12/23/2020   HDL 62.30  12/23/2020   LDLCALC 104 (H) 12/23/2020   TRIG 48.0 12/23/2020   CHOLHDL 3 12/23/2020  -She does have a history of gallbladder disease and pancreatitis -She is not on a statin -At last visit I advised her to improve diet:  reducing animal products and increasing legumes, whole grains, vegetables, and fruit along with increasing exercise  -We will repeat a lipid panel today  3. Vit D def  -No generalized aches and pains -She was previously on ergocalciferol, then over-the-counter vitamin D 4000 units but not taken consistently in the past.  In fact, she stopped it completely after she ran out -Last visit, vitamin D was low, at 26.  I advised her to try to use a pillbox to remember to take the supplement. -We will recheck the level today  Component     Latest Ref Rng 12/23/2021  Hemoglobin A1C     4.6 - 6.5 % 5.6   VITD     30.00 - 100.00 ng/mL 16.47 (L)   Cholesterol     0 - 200 mg/dL 163   Triglycerides     0.0 - 149.0 mg/dL 47.0   HDL Cholesterol     >39.00 mg/dL 54.80   VLDL     0.0 - 40.0 mg/dL 9.4   LDL (calc)     0 - 99 mg/dL 99   Total CHOL/HDL Ratio 3   NonHDL 108.36   Glucose     65 - 99 mg/dL 81   BUN     7 - 25 mg/dL 11   Creatinine     0.50 - 0.97 mg/dL 0.81   BUN/Creatinine Ratio     6 - 22 (calc) SEE NOTE:   Sodium     135 - 146 mmol/L  139   Potassium     3.5 - 5.3 mmol/L 4.7   Chloride     98 - 110 mmol/L 106   CO2     20 - 32 mmol/L 24   Calcium     8.6 - 10.2 mg/dL 9.5   Total Protein     6.1 - 8.1 g/dL 6.9   Albumin MSPROF     3.6 - 5.1 g/dL 4.3   Globulin     1.9 - 3.7 g/dL (calc) 2.6   AG Ratio     1.0 - 2.5 (calc) 1.7   Total Bilirubin     0.2 - 1.2 mg/dL 0.8   Alkaline phosphatase (APISO)     31 - 125 U/L 73   AST     10 - 30 U/L 14   ALT     6 - 29 U/L 11   Testosterone, Serum (Total)     ng/dL 23   % Free Testosterone     % 1.5   Free Testosterone, S     pg/mL 3.5   Sex Hormone Binding Globulin     nmol/L 34.2   TSH      0.35 - 5.50 uIU/mL 1.23   eGFR     > OR = 60 mL/min/1.13m 97     Labs are normal with exception of a very low vitamin D.  She absolutely needs to take the supplement consistently.  CPhilemon Kingdom MD PhD [facility]

## 2021-12-23 NOTE — Patient Instructions (Addendum)
Please stop at the lab.  Please return in 1 year.  

## 2021-12-24 LAB — COMPLETE METABOLIC PANEL WITH GFR
AG Ratio: 1.7 (calc) (ref 1.0–2.5)
ALT: 11 U/L (ref 6–29)
AST: 14 U/L (ref 10–30)
Albumin: 4.3 g/dL (ref 3.6–5.1)
Alkaline phosphatase (APISO): 73 U/L (ref 31–125)
BUN: 11 mg/dL (ref 7–25)
CO2: 24 mmol/L (ref 20–32)
Calcium: 9.5 mg/dL (ref 8.6–10.2)
Chloride: 106 mmol/L (ref 98–110)
Creat: 0.81 mg/dL (ref 0.50–0.97)
Globulin: 2.6 g/dL (calc) (ref 1.9–3.7)
Glucose, Bld: 81 mg/dL (ref 65–99)
Potassium: 4.7 mmol/L (ref 3.5–5.3)
Sodium: 139 mmol/L (ref 135–146)
Total Bilirubin: 0.8 mg/dL (ref 0.2–1.2)
Total Protein: 6.9 g/dL (ref 6.1–8.1)
eGFR: 97 mL/min/{1.73_m2} (ref >=60)

## 2022-01-01 LAB — TESTOSTERONE, FREE AND TOTAL (INCLUDES SHBG)-(MALES)
% Free Testosterone: 1.5 %
Free Testosterone, S: 3.5 pg/mL
Sex Hormone Binding Globulin: 34.2 nmol/L
Testosterone, Serum (Total): 23 ng/dL

## 2022-12-26 ENCOUNTER — Ambulatory Visit: Payer: Self-pay | Admitting: Internal Medicine

## 2023-05-15 ENCOUNTER — Encounter: Payer: Self-pay | Admitting: Internal Medicine

## 2023-05-15 ENCOUNTER — Ambulatory Visit: Payer: 59 | Admitting: Internal Medicine

## 2023-05-15 VITALS — BP 110/64 | HR 84 | Ht 60.0 in | Wt 137.6 lb

## 2023-05-15 DIAGNOSIS — E559 Vitamin D deficiency, unspecified: Secondary | ICD-10-CM | POA: Diagnosis not present

## 2023-05-15 DIAGNOSIS — E282 Polycystic ovarian syndrome: Secondary | ICD-10-CM | POA: Diagnosis not present

## 2023-05-15 DIAGNOSIS — E785 Hyperlipidemia, unspecified: Secondary | ICD-10-CM

## 2023-05-15 NOTE — Patient Instructions (Addendum)
 Please stop at the lab.  Continue vitamin D 4000 units daily.  Please return in 1 year.

## 2023-05-15 NOTE — Progress Notes (Addendum)
 Patient ID: Priscilla Rodriguez, female   DOB: Feb 24, 1986, 38 y.o.   MRN: [id number]  HPI: Priscilla Rodriguez is a 38 y.o. female, returning for follow-up for PCOS and vit D def. Last visit 1.5 year sago.  Interim history: She still has monthly menstrual cycles. She feels that her acne and hirsutism are stable. She restarted Ovasitol - 1 mo ago. Also, Mg glycinate at night, L-theanine, vit C as needed. In the last 2 months, she also started to take her vitamin D  consistently. She had more stress since last visit as she broke up with her boyfriend few months ago.  Reviewed and addended history: She was diagnosed with PCOS by OB/GYN in 2014.  At that time, she had irregular menses and pain during intercourse. She had a TV U/S >> multiple bilateral ovarian cysts.   Weight gain: - Significant reduction in weight in 2020-2021: Lowest weight: 119 pounds after starting Ovasitol and cutting down carbs.  - no steroid use - no weight loss meds - Diets tried: none - saw Priscilla Rodriguez (nutrition) - Exercise: - Has sludge in her GB >> had 2 pancreatitis attacks.  No history of hypertriglyceridemia.  Fertility/Menstrual cycles:  - History of irregular menses, but they became regular - + History of ovarian cysts - children: 0 - miscarriages: 0 - she was on OCP for dysmenorrhea and menorrhagia from 38 y/o >> college >> stopped for 2 years >> dysmenorrhea and irregular menses >> several negative pregnancy tests >> tried NuvaRing in 2010 >> restarted OCPs in 2014: Jolessa, then Sprintec.  She started 2016.  Afterwards, came off OCPs, and had Provera at hand but did not have to use it. - has uterus didelphis (double uterus)  She stopped OCPs in 08/2014 and she did not want to be on this for life.  She has regular cycles but has Provera at hand.  Acne: -Previously severe on face, mostly on chin, but improved after reducing carbs  Hirsutism: -Genetic - unchanged -On chin -Plucks it every day or every other  day  Treatments tried: -She declined metformin -off OCPs -Did not try spironolactone -did not try Vaniqua -Ovasitol - ran out 10/2021 >> restarted 04/2023.  Other meds: -She was previously on Cymbalta, now off -off Vyvanse, off Adderall  -Latest CMP was normal:   Chemistry      Component Value Date/Time   NA 139 12/23/2021 0957   K 4.7 12/23/2021 0957   CL 106 12/23/2021 0957   CO2 24 12/23/2021 0957   BUN 11 12/23/2021 0957   CREATININE 0.81 12/23/2021 0957      Component Value Date/Time   CALCIUM 9.5 12/23/2021 0957   ALKPHOS 55 12/23/2020 0935   AST 14 12/23/2021 0957   ALT 11 12/23/2021 0957   BILITOT 0.8 12/23/2021 0957     -Latest lipid panel: Lab Results  Component Value Date   CHOL 163 12/23/2021   HDL 54.80 12/23/2021   LDLCALC 99 12/23/2021   TRIG 47.0 12/23/2021   CHOLHDL 3 12/23/2021   Reviewed testosterone  levels: Component     Latest Ref Rng 12/26/2018 12/24/2019 12/23/2020 12/23/2021  Testosterone , Serum (Total)     ng/dL 23  33  16  23   % Free Testosterone      % 1.1  1.1  0.7  1.5   Free Testosterone , S     pg/mL 2.5  3.6  1.1  3.5   Sex Hormone Binding Globulin     nmol/L 48.5  35.4  37.3 (C)  34.2   ...  Other pertinent labs reviewed: Lab Results  Component Value Date   HGBA1C 5.6 12/23/2021   HGBA1C 5.5 12/23/2020   HGBA1C 5.3 12/24/2019   HGBA1C 5.3 12/26/2018   HGBA1C 5.5 11/29/2017   HGBA1C 5.5 11/29/2016   HGBA1C 5.2 11/20/2014   HGBA1C 5.5 05/05/2014   Lab Results  Component Value Date   PROLACTIN 12.6 05/05/2014   Lab Results  Component Value Date   TSH 1.23 12/23/2021   TSH 1.23 12/23/2020   TSH 0.74 05/05/2014   She has a history of vitamin D  deficiency.  Previously on high-dose vitamin D , ergocalciferol  weekly, then on vitamin D  2000 units daily, which we increased to 4000 units daily but she was not taking this consistently in 12/2021: Lab Results  Component Value Date   VD25OH 16.47 (L) 12/23/2021   VD25OH 26.0  (L) 12/23/2020   VD25OH 25.29 (L) 12/24/2019   VD25OH 32.13 12/26/2018   VD25OH 21.68 (L) 11/29/2017   VD25OH 23.09 (L) 11/29/2016   VD25OH 18.22 (L) 11/20/2014   VD25OH 17.82 (L) 05/05/2014   She takes potassium occasionally as needed for muscle cramps.  ROS: + See HPI  I reviewed pt's medications, allergies, PMH, social hx, family hx, and changes were documented in the history of present illness. Otherwise, unchanged from my initial visit note.  Past Medical History:  Diagnosis Date   Dysmenorrhea    Gallbladder attack    PCOS (polycystic ovarian syndrome)    Past Surgical History:  Procedure Laterality Date   TRIGGER FINGER RELEASE     History   Social History   Marital Status: Single    Spouse Name: N/A    Number of Children: 0   Occupational History   receptionist   Social History Main Topics   Smoking status: Never Smoker    Smokeless tobacco: Never Used   Alcohol Use: No     Comment: 3-5 times/year.   Drug Use: No   Current Outpatient Medications  Medication Sig Dispense Refill   cholecalciferol (VITAMIN D ) 1000 units tablet Take 1,000 Units by mouth daily.     lisdexamfetamine (VYVANSE) 30 MG capsule TAKE 1 CAPSULE BY MOUTH EVERY DAY IN THE MORNING FOR 30 DAYS     Multiple Vitamin (MULTIVITAMIN) tablet Take 1 tablet by mouth daily.     No current facility-administered medications for this visit.    Allergies  Allergen Reactions   Penicillins Rash   Family History  Problem Relation Age of Onset   Hyperlipidemia Father    Cancer Paternal Grandmother        breast  - DM2 in PGM - HTN in F  PE: BP 110/64   Pulse 84   Ht 5' (1.524 m)   Wt 137 lb 9.6 oz (62.4 kg)   SpO2 98%   BMI 26.87 kg/m  Wt Readings from Last 3 Encounters:  05/15/23 137 lb 9.6 oz (62.4 kg)  12/23/21 136 lb 9.6 oz (62 kg)  12/23/20 139 lb 3.2 oz (63.1 kg)   Constitutional: normal weight, in NAD Eyes: EOMI, no exophthalmos ENT: no thyromegaly, no cervical  lymphadenopathy Cardiovascular: RRR, No MRG Respiratory: CTA B Musculoskeletal: no deformities Skin: no rashes, + few acne spots on her chin, no dark terminal hair on chin, + vellum on sideburns, no acanthosis nigricans Neurological: no tremor with outstretched hands  ASSESSMENT: 1. PCOS  2. HL  3. Vit D def  PLAN: 1. PCOS -Patient with history of irregular menstrual cycles, hirsutism,  multiple ovarian cysts -Testosterone  levels were repeatedly normal -Before the coronavirus pandemic, she lost 30 pounds, by reducing carbs.  She maintains the majority of her weight loss but before last visit she gained 15 pounds (increased stress at work -but she ended up changing jobs in this).  She was not exercising and I recommended to incorporate consistent exercise, 5 times a week. -She continues to have monthly menstrual cycles.  She has Provera at hand.  We discussed that she may need this if she starts to have less than 4 menstrual cycles a year.  She was previously on OCPs (Ortho-Cyclen/Sprintec) but would not necessarily want to restart. -We did discuss about metformin in the past but she declined as she did not believe she had insulin resistance. -She was on Ovasitol in the past but ran out 2 months prior to our last appointment.  We discussed about possibly restarting this, as this was maintaining her insulin sensitivity. She restarted Ovasitol 1 mo ago. -Latest HbA1c was normal, at 5.6% -At today's visit we will repeat her HbA1c, lipid panel, TSH, and vitamin D .  We decided not to recheck her test sodium level again, as this was stable for the last several years.  Also, she did not notice an increase in acne and hirsutism. -I will see her back in a year  2. H/o HL -Latest lipid panel was at goal: Lab Results  Component Value Date   CHOL 163 12/23/2021   HDL 54.80 12/23/2021   LDLCALC 99 12/23/2021   TRIG 47.0 12/23/2021   CHOLHDL 3 12/23/2021  -She does have a history of gallbladder  disease and pancreatitis -She is not on a statin -We discussed in the past about improving diet:  reducing animal products and increasing legumes, whole grains, vegetables, and fruit along with increasing exercise  -Will repeat a lipid panel today  3. Vit D def  -She was previously on ergocalciferol  then over-the-counter vitamin D  4000 units daily.  At last visit, she mentioned that she was not taking this consistently and I recommended a pillbox. -At last visit, vitamin D  level was very low, 16 -She tells me that she resumed taking vitamin D  consistently 2 months ago -Will repeat the level today  Orders Placed This Encounter  Procedures   TSH   Lipid Panel w/reflex Direct LDL   COMPLETE METABOLIC PANEL WITH GFR   Vitamin D , 25-hydroxy   Component     Latest Ref Rng 05/15/2023  Cholesterol     <200 mg/dL 817   Triglycerides     <150 mg/dL 72   HDL Cholesterol     > OR = 50 mg/dL 65   Total CHOL/HDL Ratio     <5.0 (calc) 2.8   Glucose     65 - 99 mg/dL 84   BUN     7 - 25 mg/dL 14   Creatinine     9.49 - 0.97 mg/dL 9.20   BUN/Creatinine Ratio     6 - 22 (calc) SEE NOTE:   Sodium     135 - 146 mmol/L 143   Potassium     3.5 - 5.3 mmol/L 4.3   Chloride     98 - 110 mmol/L 104   CO2     20 - 32 mmol/L 30   Calcium     8.6 - 10.2 mg/dL 9.8   Total Protein     6.1 - 8.1 g/dL 7.4   Albumin MSPROF     3.6 -  5.1 g/dL 4.5   Globulin     1.9 - 3.7 g/dL (calc) 2.9   AG Ratio     1.0 - 2.5 (calc) 1.6   Total Bilirubin     0.2 - 1.2 mg/dL 0.8   Alkaline phosphatase (APISO)     31 - 125 U/L 77   AST     10 - 30 U/L 13   ALT     6 - 29 U/L 9   TSH     mIU/L 1.44   Vitamin D , 25-Hydroxy     30 - 100 ng/mL 35   eGFR     > OR = 60 mL/min/1.3m2 99   LDL Cholesterol (Calc)     mg/dL (calc) 898 (H)   Non-HDL Cholesterol (Calc)     <130 mg/dL (calc) 882   Labs are at goal with a very slightly elevated LDL.  Lela Fendt, MD PhD [facility]

## 2023-05-16 LAB — COMPLETE METABOLIC PANEL WITH GFR
AG Ratio: 1.6 (calc) (ref 1.0–2.5)
ALT: 9 U/L (ref 6–29)
AST: 13 U/L (ref 10–30)
Albumin: 4.5 g/dL (ref 3.6–5.1)
Alkaline phosphatase (APISO): 77 U/L (ref 31–125)
BUN: 14 mg/dL (ref 7–25)
CO2: 30 mmol/L (ref 20–32)
Calcium: 9.8 mg/dL (ref 8.6–10.2)
Chloride: 104 mmol/L (ref 98–110)
Creat: 0.79 mg/dL (ref 0.50–0.97)
Globulin: 2.9 g/dL (ref 1.9–3.7)
Glucose, Bld: 84 mg/dL (ref 65–99)
Potassium: 4.3 mmol/L (ref 3.5–5.3)
Sodium: 143 mmol/L (ref 135–146)
Total Bilirubin: 0.8 mg/dL (ref 0.2–1.2)
Total Protein: 7.4 g/dL (ref 6.1–8.1)
eGFR: 99 mL/min/{1.73_m2} (ref >=60)

## 2023-05-16 LAB — LIPID PANEL W/REFLEX DIRECT LDL
Cholesterol: 182 mg/dL (ref <200)
HDL: 65 mg/dL (ref >=50)
LDL Cholesterol (Calc): 101 mg/dL — ABNORMAL HIGH
Non-HDL Cholesterol (Calc): 117 mg/dL (ref <130)
Total CHOL/HDL Ratio: 2.8 (calc) (ref <5.0)
Triglycerides: 72 mg/dL (ref <150)

## 2023-05-16 LAB — VITAMIN D 25 HYDROXY (VIT D DEFICIENCY, FRACTURES): Vit D, 25-Hydroxy: 35 ng/mL (ref 30–100)

## 2023-05-16 LAB — TSH: TSH: 1.44 m[IU]/L

## 2023-10-29 DIAGNOSIS — J029 Acute pharyngitis, unspecified: Secondary | ICD-10-CM | POA: Diagnosis not present

## 2023-10-29 DIAGNOSIS — H6121 Impacted cerumen, right ear: Secondary | ICD-10-CM | POA: Diagnosis not present

## 2024-04-11 DIAGNOSIS — L218 Other seborrheic dermatitis: Secondary | ICD-10-CM | POA: Diagnosis not present

## 2024-04-16 ENCOUNTER — Other Ambulatory Visit: Payer: Self-pay

## 2024-04-16 ENCOUNTER — Emergency Department (HOSPITAL_COMMUNITY)
Admission: EM | Admit: 2024-04-16 | Discharge: 2024-04-16 | Discharge status: Against Medical Advice | Payer: Self-pay | Attending: Emergency Medicine | Admitting: Emergency Medicine

## 2024-04-16 ENCOUNTER — Encounter (HOSPITAL_COMMUNITY): Payer: Self-pay

## 2024-04-16 DIAGNOSIS — Z5321 Procedure and treatment not carried out due to patient leaving prior to being seen by health care provider: Secondary | ICD-10-CM | POA: Insufficient documentation

## 2024-04-16 DIAGNOSIS — Z203 Contact with and (suspected) exposure to rabies: Secondary | ICD-10-CM | POA: Insufficient documentation

## 2024-04-16 NOTE — ED Triage Notes (Signed)
 Patient exposed to a cat with rabies. Was not bitten.

## 2024-04-17 ENCOUNTER — Encounter (HOSPITAL_COMMUNITY): Payer: Self-pay | Admitting: *Deleted

## 2024-04-17 ENCOUNTER — Other Ambulatory Visit: Payer: Self-pay

## 2024-04-17 ENCOUNTER — Emergency Department (HOSPITAL_COMMUNITY)
Admission: EM | Admit: 2024-04-17 | Discharge: 2024-04-17 | Disposition: A | Discharge status: Home / Self Care | Payer: Worker's Compensation | Attending: Emergency Medicine | Admitting: Emergency Medicine

## 2024-04-17 DIAGNOSIS — Z2914 Encounter for prophylactic rabies immune globin: Secondary | ICD-10-CM | POA: Insufficient documentation

## 2024-04-17 DIAGNOSIS — Z203 Contact with and (suspected) exposure to rabies: Secondary | ICD-10-CM

## 2024-04-17 DIAGNOSIS — Z23 Encounter for immunization: Secondary | ICD-10-CM | POA: Insufficient documentation

## 2024-04-17 MED ORDER — RABIES IMMUNE GLOBULIN 300 UNIT/2ML IJ SOLN
20.0000 [IU]/kg | Freq: Once | INTRAMUSCULAR | Status: AC
  Administered 2024-04-17: 1200 [IU] via INTRAMUSCULAR
  Filled 2024-04-17: qty 8

## 2024-04-17 MED ORDER — RABIES VIRUS VACCINE, HDC IM SUSR
1.0000 mL | Freq: Once | INTRAMUSCULAR | Status: AC
  Administered 2024-04-17: 1 mL via INTRAMUSCULAR
  Filled 2024-04-17: qty 1

## 2024-04-17 NOTE — ED Triage Notes (Signed)
 Pt touched a cat that had rabies.  Pt was not bitten.  Pt has been feeling fine.

## 2024-04-17 NOTE — ED Notes (Signed)
 Rabies vaccines administered.

## 2024-04-17 NOTE — ED Provider Notes (Signed)
 [facility] AT [facility] Provider Note   CSN: [id number] Arrival date & time: 04/17/24  9290     Patient presents with: Rabies Injection   Priscilla Rodriguez is a 38 y.o. female.   HPI Patient works as a museum/gallery conservator.  The clinic had been treating a stray cat for about 3 weeks.  The cat died and was tested for rabies and tested positive.  The patient has not been bitten or scratched by the animal but did handle the animal through the course of its care.  Per recommendations from animal control all of the individuals working with animal need to get a rabies immunization series.  The patient herself had never previously been vaccinated for rabies.  She has had no symptoms.  Is otherwise been clinically well.    Prior to Admission medications  Medication Sig Start Date End Date Taking? Authorizing Provider  cholecalciferol (VITAMIN D ) 1000 units tablet Take 4,000 Units by mouth daily.    [provider]  lisdexamfetamine (VYVANSE) 30 MG capsule TAKE 1 CAPSULE BY MOUTH EVERY DAY IN THE MORNING FOR 30 DAYS 12/22/21   [provider]  Multiple Vitamin (MULTIVITAMIN) tablet Take 1 tablet by mouth daily.    [provider]    Allergies: Penicillins    Review of Systems  Updated Vital Signs BP 110/69 (BP Location: Left Arm)   Pulse 88   Temp 98 F (36.7 C) (Oral)   Resp 16   Ht 5' (1.524 m)   Wt 63.5 kg   SpO2 100%   BMI 27.34 kg/m   Physical Exam Constitutional:      Comments: Alert nontoxic well in appearance.  HENT:     Mouth/Throat:     Pharynx: Oropharynx is clear.  Cardiovascular:     Rate and Rhythm: Normal rate and regular rhythm.  Pulmonary:     Effort: Pulmonary effort is normal.     Breath sounds: Normal breath sounds.  Abdominal:     General: There is no distension.     Palpations: Abdomen is soft.     Tenderness: There is no abdominal tenderness.  Musculoskeletal:        General: No swelling or tenderness.  Normal range of motion.     Right lower leg: No edema.     Left lower leg: No edema.  Skin:    General: Skin is warm and dry.  Neurological:     General: No focal deficit present.     Mental Status: She is oriented to person, place, and time.     Motor: No weakness.     Coordination: Coordination normal.  Psychiatric:        Mood and Affect: Mood normal.     (all labs ordered are listed, but only abnormal results are displayed) Labs Reviewed - No data to display  EKG: None  Radiology: No results found.   Procedures   Medications Ordered in the ED  rabies immune globulin (HYPERRAB) injection 1,200 Units (has no administration in time range)  rabies vaccine, human diploid (IMOVAX) injection 1 mL (has no administration in time range)                                    Medical Decision Making  Patient presents as outlined.  She has had exposure to a confirmed cat with rabies in the setting of vet clinic.  She  has not had any bite or open wound in association with this.  The individuals working and in contact with animal are all being recommended for rabies prophylaxis treatment.  Will initiate rabies prophylaxis in the emergency department.     Final diagnoses:  Need for post exposure prophylaxis for rabies    ED Discharge Orders     None          Armenta Canning, MD 04/17/24 [phone]

## 2024-04-17 NOTE — Discharge Instructions (Addendum)
°                                  RABIES VACCINE FOLLOW UP  Patient's Name: Priscilla Rodriguez                     Original Order Date:04/17/2024  Medical Record Number: [id number]  ED Physician: Armenta Canning, MD Primary Diagnosis: Rabies Exposure       PCP: Cleotilde Planas, MD  Patient Phone Number: (home) [phone] (home)    (cell)  Telephone Information:  Mobile [phone]    (work) There is no work phone number on file. Species of Animal:     You have been seen in the Emergency Department for a possible rabies exposure. It's very important you return for the additional vaccine doses.  Please call the clinic listed below for hours of operation.   Clinic that will administer your rabies vaccines: [facility] - [address] [place], [place] [address]  [phone]  DAY 0:  04/17/2024      DAY 3:  04/20/2024       DAY 7:  04/24/2024     DAY 14:  05/01/2024         The 5th vaccine injection is considered for immune compromised patients only.  DAY 28:  05/15/2024

## 2024-04-20 ENCOUNTER — Emergency Department (HOSPITAL_COMMUNITY)
Admission: EM | Admit: 2024-04-20 | Discharge: 2024-04-20 | Disposition: A | Discharge status: Home / Self Care | Payer: Worker's Compensation

## 2024-04-20 ENCOUNTER — Other Ambulatory Visit: Payer: Self-pay

## 2024-04-20 DIAGNOSIS — Z23 Encounter for immunization: Secondary | ICD-10-CM | POA: Diagnosis present

## 2024-04-20 DIAGNOSIS — Z203 Contact with and (suspected) exposure to rabies: Secondary | ICD-10-CM | POA: Diagnosis not present

## 2024-04-20 MED ORDER — RABIES VIRUS VACCINE, HDC IM SUSR
1.0000 mL | Freq: Once | INTRAMUSCULAR | Status: AC
  Administered 2024-04-20: 1 mL via INTRAMUSCULAR
  Filled 2024-04-20 (×2): qty 1

## 2024-04-20 NOTE — ED Provider Notes (Signed)
°  [place] EMERGENCY DEPARTMENT AT [facility] Provider Note   CSN: [id number] Arrival date & time: 04/20/24  1050     Patient presents with: Rabies Injection   Priscilla Rodriguez is a 38 y.o. female.   Patient here for rabies vaccine.  SHe was exposed at work.  She is asymptomatic and has no complaints.        Prior to Admission medications  Medication Sig Start Date End Date Taking? Authorizing Provider  cholecalciferol (VITAMIN D ) 1000 units tablet Take 4,000 Units by mouth daily.    [provider]  lisdexamfetamine (VYVANSE) 30 MG capsule TAKE 1 CAPSULE BY MOUTH EVERY DAY IN THE MORNING FOR 30 DAYS 12/22/21   [provider]  Multiple Vitamin (MULTIVITAMIN) tablet Take 1 tablet by mouth daily.    [provider]    Allergies: Penicillins    Review of Systems  Updated Vital Signs BP (!) 109/92 (BP Location: Right Arm)   Pulse 87   Temp 98.6 F (37 C) (Oral)   Resp 16   SpO2 100%   Physical Exam Vitals and nursing note reviewed.  Cardiovascular:     Rate and Rhythm: Normal rate and regular rhythm.  Pulmonary:     Effort: Pulmonary effort is normal.  Neurological:     General: No focal deficit present.     Mental Status: She is alert.  Psychiatric:        Mood and Affect: Mood normal.        Behavior: Behavior normal.     (all labs ordered are listed, but only abnormal results are displayed) Labs Reviewed - No data to display  EKG: None  Radiology: No results found.   Procedures   Medications Ordered in the ED  rabies vaccine, human diploid (IMOVAX) injection 1 mL (has no administration in time range)                                    Medical Decision Making 38 year old here for follow-up rabies vaccine.  Vital stable.  Asymptomatic.  Next vaccine is on day 7: 04/24/2024 DAY 14: 05/01/2024 The 5th vaccine injection is considered for immune compromised patients only. DAY 28: 05/15/2024   Stable for  discharge.   Risk Prescription drug management.       Final diagnoses:  None    ED Discharge Orders     None          Neysa Caron PARAS, DO 04/20/24 1145

## 2024-04-20 NOTE — Discharge Instructions (Signed)
 Clinic that will administer your rabies vaccines:  DAY 7: 04/24/2024  DAY 14: 05/01/2024   The 5th vaccine injection is considered for immune compromised patients only. DAY 28: 05/15/2024

## 2024-04-20 NOTE — ED Triage Notes (Signed)
 Pt ambulatory to triage for a follow up rabies vaccine.

## 2024-04-24 ENCOUNTER — Encounter (HOSPITAL_COMMUNITY): Payer: Self-pay

## 2024-04-24 ENCOUNTER — Other Ambulatory Visit: Payer: Self-pay

## 2024-04-24 ENCOUNTER — Emergency Department (HOSPITAL_COMMUNITY)
Admission: EM | Admit: 2024-04-24 | Discharge: 2024-04-24 | Disposition: A | Discharge status: Home / Self Care | Payer: Worker's Compensation | Source: Home / Self Care | Attending: Emergency Medicine | Admitting: Emergency Medicine

## 2024-04-24 DIAGNOSIS — Y990 Civilian activity done for income or pay: Secondary | ICD-10-CM | POA: Insufficient documentation

## 2024-04-24 DIAGNOSIS — Z23 Encounter for immunization: Secondary | ICD-10-CM | POA: Insufficient documentation

## 2024-04-24 MED ADMIN — Rabies Virus Vaccine, HDC For Inj Susp: 1 mL | INTRAMUSCULAR | @ 09:00:00 | NDC 49281025251

## 2024-04-24 MED FILL — Rabies Virus Vaccine, HDC For Inj Susp: 1.0000 mL | INTRAMUSCULAR | Qty: 1 | Status: AC

## 2024-04-24 NOTE — ED Triage Notes (Signed)
 Pt here to receive third dose of rabies vaccine after exposure, no bite or scratch. Pt has tolerated previous shots, states she had a rash around the site last time she received it, but it has subsided.

## 2024-04-24 NOTE — ED Provider Notes (Signed)
°   EMERGENCY DEPARTMENT AT [facility] Provider Note   CSN: [id number] Arrival date & time: 04/24/24  9273     Patient presents with: Rabies Injection   Priscilla Rodriguez is a 38 y.o. female.   Patient is a 38 year old female presenting today for her third rabies vaccine.  She has no complaints at this time  The history is provided by the patient.       Prior to Admission medications  Medication Sig Start Date End Date Taking? Authorizing Provider  cholecalciferol (VITAMIN D ) 1000 units tablet Take 4,000 Units by mouth daily.    [provider]  lisdexamfetamine (VYVANSE) 30 MG capsule TAKE 1 CAPSULE BY MOUTH EVERY DAY IN THE MORNING FOR 30 DAYS 12/22/21   [provider]  Multiple Vitamin (MULTIVITAMIN) tablet Take 1 tablet by mouth daily.    [provider]    Allergies: Penicillins    Review of Systems  Updated Vital Signs BP 111/73 (BP Location: Right Arm)   Pulse 96   Temp 98.4 F (36.9 C) (Oral)   Resp 16   Ht 5' (1.524 m)   Wt 63.4 kg   SpO2 100%   BMI 27.30 kg/m   Physical Exam Vitals and nursing note reviewed.  Cardiovascular:     Rate and Rhythm: Normal rate.  Neurological:     Mental Status: She is alert.     (all labs ordered are listed, but only abnormal results are displayed) Labs Reviewed - No data to display  EKG: None  Radiology: No results found.   Procedures   Medications Ordered in the ED  rabies vaccine, human diploid (IMOVAX) injection 1 mL (has no administration in time range)                                    Medical Decision Making Risk Prescription drug management.   Patient is here for her third rabies vaccine.  She will need to return for her third she already has scheduled for that.  No complications with the first vaccine and immunoglobulin.     Final diagnoses:  Need for rabies vaccination    ED Discharge Orders     None          Doretha Folks,  MD 04/24/24 [phone]

## 2024-05-01 ENCOUNTER — Emergency Department (HOSPITAL_COMMUNITY)
Admission: EM | Admit: 2024-05-01 | Discharge: 2024-05-01 | Disposition: A | Discharge status: Home / Self Care | Payer: Worker's Compensation | Attending: Emergency Medicine | Admitting: Emergency Medicine

## 2024-05-01 DIAGNOSIS — Y990 Civilian activity done for income or pay: Secondary | ICD-10-CM | POA: Insufficient documentation

## 2024-05-01 DIAGNOSIS — Z23 Encounter for immunization: Secondary | ICD-10-CM | POA: Insufficient documentation

## 2024-05-01 MED ORDER — RABIES VIRUS VACCINE, HDC IM SUSR
1.0000 mL | Freq: Once | INTRAMUSCULAR | Status: AC
  Administered 2024-05-01: 1 mL via INTRAMUSCULAR
  Filled 2024-05-01: qty 1

## 2024-05-01 NOTE — ED Triage Notes (Signed)
 Pt reports here for her last rabies shot, pt reports had the shot on 12/10, 12/14, 12/18, and today would make the last day.

## 2024-05-01 NOTE — ED Provider Notes (Signed)
 " [place] EMERGENCY DEPARTMENT AT [facility] Provider Note   CSN: [id number] Arrival date & time: 05/01/24  9156     Patient presents with: Rabies Injection   Priscilla Rodriguez is a 38 y.o. female who presents today for her last rabies vaccination.  Patient had a feline rabies exposure at work without any reported bite wounds.  The patient has no complaints at this time.   HPI     Prior to Admission medications  Medication Sig Start Date End Date Taking? Authorizing Provider  cholecalciferol (VITAMIN D ) 1000 units tablet Take 4,000 Units by mouth daily.    [provider]  lisdexamfetamine (VYVANSE) 30 MG capsule TAKE 1 CAPSULE BY MOUTH EVERY DAY IN THE MORNING FOR 30 DAYS 12/22/21   [provider]  Multiple Vitamin (MULTIVITAMIN) tablet Take 1 tablet by mouth daily.    [provider]    Allergies: Penicillins    Review of Systems  Reason unable to perform ROS: Not applicable.    Updated Vital Signs BP 111/76 (BP Location: Right Arm)   Pulse 88   Temp 98.6 F (37 C) (Oral)   Resp 17   LMP  (LMP Unknown)   SpO2 100%   Physical Exam Vitals and nursing note reviewed.  Constitutional:      General: She is not in acute distress.    Appearance: Normal appearance.  HENT:     Head: Normocephalic and atraumatic.  Eyes:     Extraocular Movements: Extraocular movements intact.     Conjunctiva/sclera: Conjunctivae normal.     Pupils: Pupils are equal, round, and reactive to light.  Cardiovascular:     Rate and Rhythm: Normal rate and regular rhythm.     Pulses: Normal pulses.  Pulmonary:     Effort: Pulmonary effort is normal. No respiratory distress.  Neurological:     Mental Status: She is alert.  Psychiatric:        Mood and Affect: Mood normal.    (all labs ordered are listed, but only abnormal results are displayed) Labs Reviewed - No data to display  EKG: None  Radiology: No results found.   Procedures    Medications Ordered in the ED  rabies vaccine , human diploid (IMOVAX) injection 1 mL (1 mL Intramuscular Given 05/01/24 1019)                                 Medical Decision Making Risk Prescription drug management.   Patient presents to the ED for: Rabies vaccination - last dose in series  Clinical Course as of 05/01/24 1107  Thu May 01, 2024  1030 IMOVAX injection administered well tolerated without any acute reaction. [ML]    Clinical Course User Index [ML] Willma Duwaine CROME, [place]    Management / Treatments: See ED course above for medications, treatments administered, and clinical rationale.    ED Course / Reassessments: Problem List:  38 year old female presented for her last rabies vaccination.  Patient reports no complications with full series and immunoglobulin.  Instructed to follow-up with her PCP for further evaluation and care.  Disposition: Disposition: Discharge with close follow-up with PCP for further evaluation if symptoms arise. Rationale for disposition: stable for discharge The disposition plan and rationale were discussed with the patient at the bedside, all questions were addressed, and the patient demonstrated understanding.  This note was produced using Electronics Engineer. While I have reviewed and  verified all clinical information, transcription errors may remain.      Final diagnoses:  Need for rabies vaccination    ED Discharge Orders     None          Willma Duwaine CROME, [place] 05/01/24 1111    Cottie Donnice PARAS, MD 05/01/24 1203  "

## 2024-05-14 ENCOUNTER — Ambulatory Visit: Payer: 59 | Admitting: Internal Medicine

## 2024-05-14 ENCOUNTER — Encounter: Payer: Self-pay | Admitting: Internal Medicine

## 2024-05-14 ENCOUNTER — Other Ambulatory Visit: Payer: Self-pay

## 2024-05-14 VITALS — BP 94/60 | HR 74 | Resp 18 | Ht 60.0 in | Wt 148.2 lb

## 2024-05-14 DIAGNOSIS — E282 Polycystic ovarian syndrome: Secondary | ICD-10-CM

## 2024-05-14 DIAGNOSIS — E785 Hyperlipidemia, unspecified: Secondary | ICD-10-CM

## 2024-05-14 DIAGNOSIS — E559 Vitamin D deficiency, unspecified: Secondary | ICD-10-CM

## 2024-05-14 NOTE — Progress Notes (Addendum)
 Patient ID: Priscilla Rodriguez, female   DOB: 12-30-1985, 39 y.o.   MRN: [id number]  HPI: Priscilla Rodriguez is a 39 y.o. female, returning for follow-up for PCOS and vit D def. Last visit 1 year ago.  Interim history: She continues to have monthly menstrual cycles. She feels that her acne and hirsutism are stable.  Acne is increased in the week prior to her menstrual cycle. She is currently on Ovasitol (off and on, currently off but plans to restart as she feels better on it-some weight loss and decrease bloating), Mg glycinate at night, L-theanine, vit C as needed.  She is taking her vitamin D  consistently. She recently had to have rabies vaccine  - she works in a therapist, sports.  Reviewed and addended history: She was diagnosed with PCOS by OB/GYN in 2014.  At that time, she had irregular menses and pain during intercourse. She had a TV U/S >> multiple bilateral ovarian cysts.   Weight gain: - Significant reduction in weight in 2020-2021: Lowest weight: 119 pounds after starting Ovasitol and cutting down carbs.  - no steroid use - no weight loss meds - Diets tried: none - saw Leita Gasmen (nutrition) - Exercise: - Has sludge in her GB >> had 2 pancreatitis attacks.  No history of hypertriglyceridemia.  Fertility/Menstrual cycles:  - History of irregular menses, but they are now regular, occasionally 1-2 weeks late - + History of ovarian cysts - children: 0 - miscarriages: 0 - she was on OCP for dysmenorrhea and menorrhagia from 39 y/o >> college >> stopped for 2 years >> dysmenorrhea and irregular menses >> several negative pregnancy tests >> tried NuvaRing in 2010 >> restarted OCPs in 2014: Jolessa, then Sprintec.  She started 2016.  Afterwards, came off OCPs, and had Provera at hand but did not have to use it. - has uterus didelphis (double uterus)  She stopped OCPs in 08/2014 and she did not want to be on this for life.  She has regular cycles but has Provera at hand.  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 - Plucks it every day or every other day - She would like to get laser treatment  Treatments tried: -She declined metformin -off OCPs -Did not try spironolactone -did not try Vaniqua -Ovasitol -off-and-on, now off  Other meds: -She was previously on Cymbalta, now off -off Vyvanse, off Adderall  -Latest CMP was normal:   Chemistry      Component Value Date/Time   NA 143 05/15/2023 0919   K 4.3 05/15/2023 0919   CL 104 05/15/2023 0919   CO2 30 05/15/2023 0919   BUN 14 05/15/2023 0919   CREATININE 0.79 05/15/2023 0919      Component Value Date/Time   CALCIUM 9.8 05/15/2023 0919   ALKPHOS 55 12/23/2020 0935   AST 13 05/15/2023 0919   ALT 9 05/15/2023 0919   BILITOT 0.8 05/15/2023 0919     -Latest lipid panel: Lab Results  Component Value Date   CHOL 182 05/15/2023   HDL 65 05/15/2023   LDLCALC 101 (H) 05/15/2023   TRIG 72 05/15/2023   CHOLHDL 2.8 05/15/2023   Reviewed testosterone  levels: Component     Latest Ref Rng 12/26/2018 12/24/2019 12/23/2020 12/23/2021  Testosterone , Serum (Total)     ng/dL 23  33  16  23   % Free Testosterone      % 1.1  1.1  0.7  1.5  Free Testosterone , S     pg/mL 2.5  3.6  1.1  3.5   Sex Hormone Binding Globulin     nmol/L 48.5  35.4  37.3 (C) 34.2   ...  Other pertinent labs reviewed: Lab Results  Component Value Date   HGBA1C 5.6 12/23/2021   HGBA1C 5.5 12/23/2020   HGBA1C 5.3 12/24/2019   HGBA1C 5.3 12/26/2018   HGBA1C 5.5 11/29/2017   HGBA1C 5.5 11/29/2016   HGBA1C 5.2 11/20/2014   HGBA1C 5.5 05/05/2014   Lab Results  Component Value Date   PROLACTIN 12.6 05/05/2014   Lab Results  Component Value Date   TSH 1.44 05/15/2023   TSH 1.23 12/23/2021   TSH 1.23 12/23/2020   TSH 0.74 05/05/2014   She has a history of vitamin D  deficiency.  Previously on high-dose vitamin D , ergocalciferol  weekly, then on vitamin D   2000 units daily, which we increased to 4000 units daily.  Reviewed vitamin D  levels: Lab Results  Component Value Date   VD25OH 35 05/15/2023   VD25OH 16.47 (L) 12/23/2021   VD25OH 26.0 (L) 12/23/2020   VD25OH 25.29 (L) 12/24/2019   VD25OH 32.13 12/26/2018   VD25OH 21.68 (L) 11/29/2017   VD25OH 23.09 (L) 11/29/2016   VD25OH 18.22 (L) 11/20/2014   VD25OH 17.82 (L) 05/05/2014   She takes potassium occasionally as needed for muscle cramps.  ROS: + See HPI  I reviewed pt's medications, allergies, PMH, social hx, family hx, and changes were documented in the history of present illness. Otherwise, unchanged from my initial visit note.  Past Medical History:  Diagnosis Date   Dysmenorrhea    Gallbladder attack    PCOS (polycystic ovarian syndrome)    Past Surgical History:  Procedure Laterality Date   TRIGGER FINGER RELEASE     History   Social History   Marital Status: Single    Spouse Name: N/A    Number of Children: 0   Occupational History   receptionist   Social History Main Topics   Smoking status: Never Smoker    Smokeless tobacco: Never Used   Alcohol Use: No     Comment: 3-5 times/year.   Drug Use: No   Current Outpatient Medications  Medication Sig Dispense Refill   cholecalciferol (VITAMIN D ) 1000 units tablet Take 4,000 Units by mouth daily.     lisdexamfetamine (VYVANSE) 30 MG capsule TAKE 1 CAPSULE BY MOUTH EVERY DAY IN THE MORNING FOR 30 DAYS     Multiple Vitamin (MULTIVITAMIN) tablet Take 1 tablet by mouth daily.     No current facility-administered medications for this visit.    Allergies  Allergen Reactions   Penicillins Rash   Family History  Problem Relation Age of Onset   Hyperlipidemia Father    Cancer Paternal Grandmother        breast  - DM2 in PGM - HTN in F  PE: BP 94/60   Pulse 74   Resp 18   Ht 5' (1.524 m)   Wt 148 lb 3.2 oz (67.2 kg)   LMP  (LMP Unknown)   SpO2 98%   BMI 28.94 kg/m  Wt Readings from Last 15  Encounters:  05/14/24 148 lb 3.2 oz (67.2 kg)  04/24/24 139 lb 12.4 oz (63.4 kg)  04/17/24 140 lb (63.5 kg)  04/16/24 150 lb (68 kg)  05/15/23 137 lb 9.6 oz (62.4 kg)  12/23/21 136 lb 9.6 oz (62 kg)  12/23/20 139 lb 3.2 oz (63.1 kg)  12/24/19 124 lb (  56.2 kg)  12/26/18 123 lb (55.8 kg)  11/29/17 119 lb 9.6 oz (54.3 kg)  11/29/16 146 lb (66.2 kg)  11/20/14 152 lb 6.4 oz (69.1 kg)  05/05/14 142 lb 12.8 oz (64.8 kg)  03/12/14 145 lb (65.8 kg)  10/22/13 147 lb 0.8 oz (66.7 kg)   Constitutional: normal weight, in NAD Eyes: EOMI, no exophthalmos ENT: no thyromegaly, no cervical lymphadenopathy Cardiovascular: RRR, No MRG Respiratory: CTA B Musculoskeletal: no deformities Skin: no rashes, + few acne spots on her chin and upper neck, no dark terminal hair on chin, + vellum on sideburns, no acanthosis nigricans Neurological: no tremor with outstretched hands  ASSESSMENT: 1. PCOS  2. HL  3. Vit D def  PLAN: 1. PCOS -Patient with history of irregular menstrual cycles which have become more regular in the last few years, hirsutism, multiple ovarian cysts but with repeated and normal testosterone  levels -Before the coronavirus pandemic, she lost 30 pounds, by reducing carbs.  Afterwards, she maintains the majority of her weight loss, but before our visit from 2024 she gained 15 pounds and we discussed about exercising-recommended to incorporate consistent exercise, 5 times a week.  Since last visit, she has an approximately stable net weight. - She continues to have monthly menstrual cycles.  She has Provera at hand.  We did discuss about using this if she has less than 4 menstrual cycles a year.  She was previously on OCPs (Ortho-Cyclene/Sprintec) but she did not want to restart this.  She also declined metformin as she did not believe she had insulin resistance.  She was off and on Ovasitol in the past.  She restarted this 1 month before our last visit.  She is currently off that she needs  to order more.  She does feel a difference when she is on this, including some weight loss and decreased bloating. -Her HbA1c level was normal at last check, at 5.6%.  We will repeat this today. - At today's visit will also recheck a TSH. -We will not recheck a testosterone  level since this was previously normal.  She does not have an increase in acne or hirsutism. -At today's visit we will repeat her HbA1c, lipid panel, TSH, and vitamin D .  We decided not to recheck her testosterone  level again, as this was stable for the last several years.  Also, she did not notice an increase in acne and hirsutism. -I will see her back in a year  2. H/o HL - Latest lipid panel was at goal with the exception of a very slightly elevated LDL: Lab Results  Component Value Date   CHOL 182 05/15/2023   HDL 65 05/15/2023   LDLCALC 101 (H) 05/15/2023   TRIG 72 05/15/2023   CHOLHDL 2.8 05/15/2023  - She does have a history of gallbladder disease and pancreatitis - She is not on a statin - We discussed in the past about improving diet:  reducing animal products and increasing legumes, whole grains, vegetables, and fruit along with increasing exercise  - We will repeat the panel today  3. Vit D def  - She was previously on ergocalciferol  but now on over-the-counter vitamin D  4000 units daily - may have miss 2 doses a month -She was previously missing doses - At last visit, vitamin D  level was normal: Lab Results  Component Value Date   VD25OH 35 05/15/2023  - Will repeat the level today  Component     Latest Ref Rng 05/14/2024  Hemoglobin A1C     <  5.7 % 5.3   Cholesterol     <200 mg/dL 841   Triglycerides     <150 mg/dL 48   HDL Cholesterol     > OR = 50 mg/dL 51   Total CHOL/HDL Ratio     <5.0 (calc) 3.1   Sodium     135 - 146 mmol/L 140   Potassium     3.5 - 5.3 mmol/L 4.3   Chloride     98 - 110 mmol/L 106   CO2     20 - 32 mmol/L 28   Glucose     65 - 99 mg/dL 81   BUN     7 - 25 mg/dL  15   Creatinine     9.49 - 0.97 mg/dL 9.38   Total Bilirubin     0.2 - 1.2 mg/dL 0.7   AST     10 - 30 U/L 15   ALT     6 - 29 U/L 13   Total Protein     6.1 - 8.1 g/dL 6.9   Calcium     8.6 - 10.2 mg/dL 9.1   TSH     mIU/L 8.95   Vitamin D , 25-Hydroxy     30 - 100 ng/mL 53   eGFR     > OR = 60 mL/min/1.67m2 117   BUN/Creatinine Ratio     6 - 22 (calc) SEE NOTE:   Albumin MSPROF     3.6 - 5.1 g/dL 4.5   Globulin     1.9 - 3.7 g/dL (calc) 2.4   AG Ratio     1.0 - 2.5 (calc) 1.9   Alkaline phosphatase (APISO)     31 - 125 U/L 68   LDL Cholesterol (Calc)     mg/dL (calc) 93   Non-HDL Cholesterol (Calc)     <130 mg/dL (calc) 892   Mean Plasma Glucose     mg/dL 894   eAG (mmol/L)     mmol/L 5.8    Labs are all at goal.  Her LDL cholesterol and HbA1c level improved.  Lela Fendt, MD PhD [facility] Endocrinology

## 2024-05-14 NOTE — Patient Instructions (Signed)
 Please stop at the lab.  Continue vitamin D 4000 units daily.  Please return in 1 year.

## 2024-05-15 ENCOUNTER — Ambulatory Visit: Payer: Self-pay | Admitting: Internal Medicine

## 2024-05-15 LAB — LIPID PANEL W/REFLEX DIRECT LDL
Cholesterol: 158 mg/dL
HDL: 51 mg/dL
LDL Cholesterol (Calc): 93 mg/dL
Non-HDL Cholesterol (Calc): 107 mg/dL
Total CHOL/HDL Ratio: 3.1 (calc)
Triglycerides: 48 mg/dL

## 2024-05-15 LAB — COMPREHENSIVE METABOLIC PANEL WITH GFR
AG Ratio: 1.9 (calc) (ref 1.0–2.5)
ALT: 13 U/L (ref 6–29)
AST: 15 U/L (ref 10–30)
Albumin: 4.5 g/dL (ref 3.6–5.1)
Alkaline phosphatase (APISO): 68 U/L (ref 31–125)
BUN: 15 mg/dL (ref 7–25)
CO2: 28 mmol/L (ref 20–32)
Calcium: 9.1 mg/dL (ref 8.6–10.2)
Chloride: 106 mmol/L (ref 98–110)
Creat: 0.61 mg/dL (ref 0.50–0.97)
Globulin: 2.4 g/dL (ref 1.9–3.7)
Glucose, Bld: 81 mg/dL (ref 65–99)
Potassium: 4.3 mmol/L (ref 3.5–5.3)
Sodium: 140 mmol/L (ref 135–146)
Total Bilirubin: 0.7 mg/dL (ref 0.2–1.2)
Total Protein: 6.9 g/dL (ref 6.1–8.1)
eGFR: 117 mL/min/1.73m2

## 2024-05-15 LAB — HEMOGLOBIN A1C
Hgb A1c MFr Bld: 5.3 %
Mean Plasma Glucose: 105 mg/dL
eAG (mmol/L): 5.8 mmol/L

## 2024-05-15 LAB — VITAMIN D 25 HYDROXY (VIT D DEFICIENCY, FRACTURES): Vit D, 25-Hydroxy: 53 ng/mL (ref 30–100)

## 2024-05-15 LAB — TSH: TSH: 1.04 m[IU]/L

## 2025-05-13 ENCOUNTER — Ambulatory Visit: Payer: Self-pay | Admitting: Internal Medicine
